# Patient Record
Sex: Male | Born: 1945
Health system: Southern US, Community
[De-identification: ages and names within clinical notes are randomized; demographics above are authoritative.]

## PROBLEM LIST (undated history)

## (undated) DIAGNOSIS — I10 Essential (primary) hypertension: Secondary | ICD-10-CM

## (undated) DIAGNOSIS — E119 Type 2 diabetes mellitus without complications: Secondary | ICD-10-CM

## (undated) HISTORY — PX: EYE SURGERY: SHX253

## (undated) HISTORY — PX: HERNIA REPAIR: SHX51

---

## 2012-09-05 DIAGNOSIS — J069 Acute upper respiratory infection, unspecified: Secondary | ICD-10-CM | POA: Diagnosis not present

## 2013-04-05 DIAGNOSIS — E785 Hyperlipidemia, unspecified: Secondary | ICD-10-CM | POA: Diagnosis not present

## 2013-04-05 DIAGNOSIS — E119 Type 2 diabetes mellitus without complications: Secondary | ICD-10-CM | POA: Diagnosis not present

## 2013-04-05 DIAGNOSIS — E669 Obesity, unspecified: Secondary | ICD-10-CM | POA: Diagnosis not present

## 2013-04-05 DIAGNOSIS — M1990 Unspecified osteoarthritis, unspecified site: Secondary | ICD-10-CM | POA: Diagnosis not present

## 2013-04-05 DIAGNOSIS — I10 Essential (primary) hypertension: Secondary | ICD-10-CM | POA: Diagnosis not present

## 2013-04-11 DIAGNOSIS — S339XXA Sprain of unspecified parts of lumbar spine and pelvis, initial encounter: Secondary | ICD-10-CM | POA: Diagnosis not present

## 2013-04-11 DIAGNOSIS — M999 Biomechanical lesion, unspecified: Secondary | ICD-10-CM | POA: Diagnosis not present

## 2013-04-12 DIAGNOSIS — M999 Biomechanical lesion, unspecified: Secondary | ICD-10-CM | POA: Diagnosis not present

## 2013-04-12 DIAGNOSIS — S339XXA Sprain of unspecified parts of lumbar spine and pelvis, initial encounter: Secondary | ICD-10-CM | POA: Diagnosis not present

## 2013-04-16 DIAGNOSIS — M999 Biomechanical lesion, unspecified: Secondary | ICD-10-CM | POA: Diagnosis not present

## 2013-04-16 DIAGNOSIS — S339XXA Sprain of unspecified parts of lumbar spine and pelvis, initial encounter: Secondary | ICD-10-CM | POA: Diagnosis not present

## 2013-04-18 DIAGNOSIS — S339XXA Sprain of unspecified parts of lumbar spine and pelvis, initial encounter: Secondary | ICD-10-CM | POA: Diagnosis not present

## 2013-04-18 DIAGNOSIS — M999 Biomechanical lesion, unspecified: Secondary | ICD-10-CM | POA: Diagnosis not present

## 2013-04-19 DIAGNOSIS — M999 Biomechanical lesion, unspecified: Secondary | ICD-10-CM | POA: Diagnosis not present

## 2013-04-19 DIAGNOSIS — S339XXA Sprain of unspecified parts of lumbar spine and pelvis, initial encounter: Secondary | ICD-10-CM | POA: Diagnosis not present

## 2013-04-23 DIAGNOSIS — S339XXA Sprain of unspecified parts of lumbar spine and pelvis, initial encounter: Secondary | ICD-10-CM | POA: Diagnosis not present

## 2013-04-23 DIAGNOSIS — M999 Biomechanical lesion, unspecified: Secondary | ICD-10-CM | POA: Diagnosis not present

## 2013-04-25 DIAGNOSIS — M999 Biomechanical lesion, unspecified: Secondary | ICD-10-CM | POA: Diagnosis not present

## 2013-04-25 DIAGNOSIS — S339XXA Sprain of unspecified parts of lumbar spine and pelvis, initial encounter: Secondary | ICD-10-CM | POA: Diagnosis not present

## 2013-04-26 DIAGNOSIS — M999 Biomechanical lesion, unspecified: Secondary | ICD-10-CM | POA: Diagnosis not present

## 2013-04-26 DIAGNOSIS — S339XXA Sprain of unspecified parts of lumbar spine and pelvis, initial encounter: Secondary | ICD-10-CM | POA: Diagnosis not present

## 2013-04-30 DIAGNOSIS — S339XXA Sprain of unspecified parts of lumbar spine and pelvis, initial encounter: Secondary | ICD-10-CM | POA: Diagnosis not present

## 2013-04-30 DIAGNOSIS — M999 Biomechanical lesion, unspecified: Secondary | ICD-10-CM | POA: Diagnosis not present

## 2013-05-02 DIAGNOSIS — M999 Biomechanical lesion, unspecified: Secondary | ICD-10-CM | POA: Diagnosis not present

## 2013-05-02 DIAGNOSIS — S339XXA Sprain of unspecified parts of lumbar spine and pelvis, initial encounter: Secondary | ICD-10-CM | POA: Diagnosis not present

## 2013-05-03 DIAGNOSIS — S339XXA Sprain of unspecified parts of lumbar spine and pelvis, initial encounter: Secondary | ICD-10-CM | POA: Diagnosis not present

## 2013-05-03 DIAGNOSIS — M999 Biomechanical lesion, unspecified: Secondary | ICD-10-CM | POA: Diagnosis not present

## 2013-05-07 DIAGNOSIS — S339XXA Sprain of unspecified parts of lumbar spine and pelvis, initial encounter: Secondary | ICD-10-CM | POA: Diagnosis not present

## 2013-05-07 DIAGNOSIS — M999 Biomechanical lesion, unspecified: Secondary | ICD-10-CM | POA: Diagnosis not present

## 2013-05-10 DIAGNOSIS — S339XXA Sprain of unspecified parts of lumbar spine and pelvis, initial encounter: Secondary | ICD-10-CM | POA: Diagnosis not present

## 2013-05-10 DIAGNOSIS — M999 Biomechanical lesion, unspecified: Secondary | ICD-10-CM | POA: Diagnosis not present

## 2013-05-14 DIAGNOSIS — S339XXA Sprain of unspecified parts of lumbar spine and pelvis, initial encounter: Secondary | ICD-10-CM | POA: Diagnosis not present

## 2013-05-14 DIAGNOSIS — M999 Biomechanical lesion, unspecified: Secondary | ICD-10-CM | POA: Diagnosis not present

## 2013-05-17 DIAGNOSIS — M999 Biomechanical lesion, unspecified: Secondary | ICD-10-CM | POA: Diagnosis not present

## 2013-05-17 DIAGNOSIS — S339XXA Sprain of unspecified parts of lumbar spine and pelvis, initial encounter: Secondary | ICD-10-CM | POA: Diagnosis not present

## 2013-05-21 DIAGNOSIS — S339XXA Sprain of unspecified parts of lumbar spine and pelvis, initial encounter: Secondary | ICD-10-CM | POA: Diagnosis not present

## 2013-05-21 DIAGNOSIS — M999 Biomechanical lesion, unspecified: Secondary | ICD-10-CM | POA: Diagnosis not present

## 2013-05-24 DIAGNOSIS — J309 Allergic rhinitis, unspecified: Secondary | ICD-10-CM | POA: Diagnosis not present

## 2013-05-24 DIAGNOSIS — J209 Acute bronchitis, unspecified: Secondary | ICD-10-CM | POA: Diagnosis not present

## 2013-05-24 DIAGNOSIS — M999 Biomechanical lesion, unspecified: Secondary | ICD-10-CM | POA: Diagnosis not present

## 2013-05-24 DIAGNOSIS — S339XXA Sprain of unspecified parts of lumbar spine and pelvis, initial encounter: Secondary | ICD-10-CM | POA: Diagnosis not present

## 2013-05-30 DIAGNOSIS — M999 Biomechanical lesion, unspecified: Secondary | ICD-10-CM | POA: Diagnosis not present

## 2013-05-30 DIAGNOSIS — L821 Other seborrheic keratosis: Secondary | ICD-10-CM | POA: Diagnosis not present

## 2013-05-30 DIAGNOSIS — L819 Disorder of pigmentation, unspecified: Secondary | ICD-10-CM | POA: Diagnosis not present

## 2013-05-30 DIAGNOSIS — S339XXA Sprain of unspecified parts of lumbar spine and pelvis, initial encounter: Secondary | ICD-10-CM | POA: Diagnosis not present

## 2013-06-06 DIAGNOSIS — M999 Biomechanical lesion, unspecified: Secondary | ICD-10-CM | POA: Diagnosis not present

## 2013-06-06 DIAGNOSIS — S339XXA Sprain of unspecified parts of lumbar spine and pelvis, initial encounter: Secondary | ICD-10-CM | POA: Diagnosis not present

## 2013-06-13 DIAGNOSIS — S339XXA Sprain of unspecified parts of lumbar spine and pelvis, initial encounter: Secondary | ICD-10-CM | POA: Diagnosis not present

## 2013-06-13 DIAGNOSIS — M999 Biomechanical lesion, unspecified: Secondary | ICD-10-CM | POA: Diagnosis not present

## 2013-06-19 DIAGNOSIS — S9030XA Contusion of unspecified foot, initial encounter: Secondary | ICD-10-CM | POA: Diagnosis not present

## 2013-06-27 DIAGNOSIS — M999 Biomechanical lesion, unspecified: Secondary | ICD-10-CM | POA: Diagnosis not present

## 2013-06-27 DIAGNOSIS — S339XXA Sprain of unspecified parts of lumbar spine and pelvis, initial encounter: Secondary | ICD-10-CM | POA: Diagnosis not present

## 2013-07-10 DIAGNOSIS — S9030XA Contusion of unspecified foot, initial encounter: Secondary | ICD-10-CM | POA: Diagnosis not present

## 2013-07-26 DIAGNOSIS — E669 Obesity, unspecified: Secondary | ICD-10-CM | POA: Diagnosis not present

## 2013-07-26 DIAGNOSIS — E119 Type 2 diabetes mellitus without complications: Secondary | ICD-10-CM | POA: Diagnosis not present

## 2013-07-26 DIAGNOSIS — G4730 Sleep apnea, unspecified: Secondary | ICD-10-CM | POA: Diagnosis not present

## 2013-07-26 DIAGNOSIS — E785 Hyperlipidemia, unspecified: Secondary | ICD-10-CM | POA: Diagnosis not present

## 2013-07-26 DIAGNOSIS — I10 Essential (primary) hypertension: Secondary | ICD-10-CM | POA: Diagnosis not present

## 2013-10-24 DIAGNOSIS — I10 Essential (primary) hypertension: Secondary | ICD-10-CM | POA: Diagnosis not present

## 2013-10-24 DIAGNOSIS — E119 Type 2 diabetes mellitus without complications: Secondary | ICD-10-CM | POA: Diagnosis not present

## 2013-10-24 DIAGNOSIS — Z6839 Body mass index (BMI) 39.0-39.9, adult: Secondary | ICD-10-CM | POA: Diagnosis not present

## 2013-10-24 DIAGNOSIS — M25579 Pain in unspecified ankle and joints of unspecified foot: Secondary | ICD-10-CM | POA: Diagnosis not present

## 2013-10-24 DIAGNOSIS — E785 Hyperlipidemia, unspecified: Secondary | ICD-10-CM | POA: Diagnosis not present

## 2013-11-06 DIAGNOSIS — E119 Type 2 diabetes mellitus without complications: Secondary | ICD-10-CM | POA: Diagnosis not present

## 2013-12-06 DIAGNOSIS — D225 Melanocytic nevi of trunk: Secondary | ICD-10-CM | POA: Diagnosis not present

## 2013-12-06 DIAGNOSIS — L820 Inflamed seborrheic keratosis: Secondary | ICD-10-CM | POA: Diagnosis not present

## 2013-12-10 DIAGNOSIS — Z23 Encounter for immunization: Secondary | ICD-10-CM | POA: Diagnosis not present

## 2013-12-10 DIAGNOSIS — Z6839 Body mass index (BMI) 39.0-39.9, adult: Secondary | ICD-10-CM | POA: Diagnosis not present

## 2013-12-10 DIAGNOSIS — Z0000 Encounter for general adult medical examination without abnormal findings: Secondary | ICD-10-CM | POA: Diagnosis not present

## 2014-01-08 DIAGNOSIS — Z1211 Encounter for screening for malignant neoplasm of colon: Secondary | ICD-10-CM | POA: Diagnosis not present

## 2014-01-11 DIAGNOSIS — Z6841 Body Mass Index (BMI) 40.0 and over, adult: Secondary | ICD-10-CM | POA: Diagnosis not present

## 2014-01-11 DIAGNOSIS — J9801 Acute bronchospasm: Secondary | ICD-10-CM | POA: Diagnosis not present

## 2015-02-24 DIAGNOSIS — M109 Gout, unspecified: Secondary | ICD-10-CM | POA: Diagnosis not present

## 2015-02-24 DIAGNOSIS — R161 Splenomegaly, not elsewhere classified: Secondary | ICD-10-CM | POA: Diagnosis not present

## 2015-02-24 DIAGNOSIS — K760 Fatty (change of) liver, not elsewhere classified: Secondary | ICD-10-CM | POA: Diagnosis not present

## 2015-02-24 DIAGNOSIS — E1140 Type 2 diabetes mellitus with diabetic neuropathy, unspecified: Secondary | ICD-10-CM | POA: Diagnosis not present

## 2015-02-24 DIAGNOSIS — I10 Essential (primary) hypertension: Secondary | ICD-10-CM | POA: Diagnosis not present

## 2015-02-24 DIAGNOSIS — J209 Acute bronchitis, unspecified: Secondary | ICD-10-CM | POA: Diagnosis not present

## 2015-02-24 DIAGNOSIS — Z79899 Other long term (current) drug therapy: Secondary | ICD-10-CM | POA: Diagnosis not present

## 2015-02-24 DIAGNOSIS — G4733 Obstructive sleep apnea (adult) (pediatric): Secondary | ICD-10-CM | POA: Diagnosis not present

## 2015-02-24 DIAGNOSIS — N400 Enlarged prostate without lower urinary tract symptoms: Secondary | ICD-10-CM | POA: Diagnosis not present

## 2015-02-24 DIAGNOSIS — K219 Gastro-esophageal reflux disease without esophagitis: Secondary | ICD-10-CM | POA: Diagnosis not present

## 2015-02-24 DIAGNOSIS — E7800 Pure hypercholesterolemia, unspecified: Secondary | ICD-10-CM | POA: Diagnosis not present

## 2015-02-28 DIAGNOSIS — G4733 Obstructive sleep apnea (adult) (pediatric): Secondary | ICD-10-CM | POA: Diagnosis not present

## 2015-03-27 DIAGNOSIS — M1990 Unspecified osteoarthritis, unspecified site: Secondary | ICD-10-CM | POA: Diagnosis not present

## 2015-03-27 DIAGNOSIS — I10 Essential (primary) hypertension: Secondary | ICD-10-CM | POA: Diagnosis not present

## 2015-03-27 DIAGNOSIS — E785 Hyperlipidemia, unspecified: Secondary | ICD-10-CM | POA: Diagnosis not present

## 2015-03-27 DIAGNOSIS — D649 Anemia, unspecified: Secondary | ICD-10-CM | POA: Diagnosis not present

## 2015-03-27 DIAGNOSIS — G4730 Sleep apnea, unspecified: Secondary | ICD-10-CM | POA: Diagnosis not present

## 2015-03-27 DIAGNOSIS — E119 Type 2 diabetes mellitus without complications: Secondary | ICD-10-CM | POA: Diagnosis not present

## 2015-03-27 DIAGNOSIS — E669 Obesity, unspecified: Secondary | ICD-10-CM | POA: Diagnosis not present

## 2015-03-27 DIAGNOSIS — Z6838 Body mass index (BMI) 38.0-38.9, adult: Secondary | ICD-10-CM | POA: Diagnosis not present

## 2015-06-03 DIAGNOSIS — L570 Actinic keratosis: Secondary | ICD-10-CM | POA: Diagnosis not present

## 2015-06-03 DIAGNOSIS — L400 Psoriasis vulgaris: Secondary | ICD-10-CM | POA: Diagnosis not present

## 2015-07-14 DIAGNOSIS — Z6838 Body mass index (BMI) 38.0-38.9, adult: Secondary | ICD-10-CM | POA: Diagnosis not present

## 2015-07-14 DIAGNOSIS — H00014 Hordeolum externum left upper eyelid: Secondary | ICD-10-CM | POA: Diagnosis not present

## 2015-07-25 DIAGNOSIS — G4730 Sleep apnea, unspecified: Secondary | ICD-10-CM | POA: Diagnosis not present

## 2015-07-25 DIAGNOSIS — E669 Obesity, unspecified: Secondary | ICD-10-CM | POA: Diagnosis not present

## 2015-07-25 DIAGNOSIS — I10 Essential (primary) hypertension: Secondary | ICD-10-CM | POA: Diagnosis not present

## 2015-07-25 DIAGNOSIS — M1990 Unspecified osteoarthritis, unspecified site: Secondary | ICD-10-CM | POA: Diagnosis not present

## 2015-07-25 DIAGNOSIS — Z6838 Body mass index (BMI) 38.0-38.9, adult: Secondary | ICD-10-CM | POA: Diagnosis not present

## 2015-07-25 DIAGNOSIS — D649 Anemia, unspecified: Secondary | ICD-10-CM | POA: Diagnosis not present

## 2015-07-25 DIAGNOSIS — E119 Type 2 diabetes mellitus without complications: Secondary | ICD-10-CM | POA: Diagnosis not present

## 2015-07-25 DIAGNOSIS — E785 Hyperlipidemia, unspecified: Secondary | ICD-10-CM | POA: Diagnosis not present

## 2015-08-18 DIAGNOSIS — N200 Calculus of kidney: Secondary | ICD-10-CM | POA: Diagnosis not present

## 2015-08-18 DIAGNOSIS — Z6839 Body mass index (BMI) 39.0-39.9, adult: Secondary | ICD-10-CM | POA: Diagnosis not present

## 2015-10-08 DIAGNOSIS — K529 Noninfective gastroenteritis and colitis, unspecified: Secondary | ICD-10-CM | POA: Diagnosis not present

## 2015-10-08 DIAGNOSIS — Z6838 Body mass index (BMI) 38.0-38.9, adult: Secondary | ICD-10-CM | POA: Diagnosis not present

## 2015-11-03 DIAGNOSIS — G4730 Sleep apnea, unspecified: Secondary | ICD-10-CM | POA: Diagnosis not present

## 2015-11-03 DIAGNOSIS — Z6838 Body mass index (BMI) 38.0-38.9, adult: Secondary | ICD-10-CM | POA: Diagnosis not present

## 2015-11-03 DIAGNOSIS — D649 Anemia, unspecified: Secondary | ICD-10-CM | POA: Diagnosis not present

## 2015-11-03 DIAGNOSIS — M1990 Unspecified osteoarthritis, unspecified site: Secondary | ICD-10-CM | POA: Diagnosis not present

## 2015-11-03 DIAGNOSIS — E119 Type 2 diabetes mellitus without complications: Secondary | ICD-10-CM | POA: Diagnosis not present

## 2015-11-03 DIAGNOSIS — E669 Obesity, unspecified: Secondary | ICD-10-CM | POA: Diagnosis not present

## 2015-11-03 DIAGNOSIS — I10 Essential (primary) hypertension: Secondary | ICD-10-CM | POA: Diagnosis not present

## 2015-11-03 DIAGNOSIS — E785 Hyperlipidemia, unspecified: Secondary | ICD-10-CM | POA: Diagnosis not present

## 2015-11-03 DIAGNOSIS — Z23 Encounter for immunization: Secondary | ICD-10-CM | POA: Diagnosis not present

## 2016-01-08 DIAGNOSIS — L719 Rosacea, unspecified: Secondary | ICD-10-CM | POA: Diagnosis not present

## 2016-01-08 DIAGNOSIS — I8310 Varicose veins of unspecified lower extremity with inflammation: Secondary | ICD-10-CM | POA: Diagnosis not present

## 2016-01-08 DIAGNOSIS — L299 Pruritus, unspecified: Secondary | ICD-10-CM | POA: Diagnosis not present

## 2016-01-09 ENCOUNTER — Encounter (INDEPENDENT_AMBULATORY_CARE_PROVIDER_SITE_OTHER): Payer: Self-pay

## 2016-01-09 ENCOUNTER — Ambulatory Visit (INDEPENDENT_AMBULATORY_CARE_PROVIDER_SITE_OTHER): Payer: PPO | Admitting: Orthopaedic Surgery

## 2016-01-09 ENCOUNTER — Encounter (INDEPENDENT_AMBULATORY_CARE_PROVIDER_SITE_OTHER): Payer: Self-pay | Admitting: Orthopaedic Surgery

## 2016-01-09 ENCOUNTER — Ambulatory Visit (INDEPENDENT_AMBULATORY_CARE_PROVIDER_SITE_OTHER): Payer: PPO

## 2016-01-09 VITALS — BP 112/61 | HR 84 | Ht 70.0 in | Wt 260.0 lb

## 2016-01-09 DIAGNOSIS — M25571 Pain in right ankle and joints of right foot: Secondary | ICD-10-CM

## 2016-01-09 DIAGNOSIS — M25562 Pain in left knee: Secondary | ICD-10-CM

## 2016-01-09 NOTE — Progress Notes (Signed)
Office Visit Note   Patient: Patrick Lawson           Date of Birth: 1945-07-25           MRN: [id number] Visit Date: 01/09/2016              Requested by: No referring provider defined for this encounter. PCP: Charlynn Court, NP   Assessment & Plan: Visit Diagnoses:  1. Left knee pain, unspecified chronicity    2.      Right heel pump bump with Achilles tendon insertional tendinopathy Plan: Patient just received a Kenalog injection for dermatitis may get some great improvement in his left knee. We'll place him in a cam boot for his right Achilles tendinopathy with a heel bump pad, a pad heel lift. Pathophysiology discussed and treatment plan reviewed.  Follow-Up Instructions: No Follow-up on file.   Orders:  Orders Placed This Encounter  Procedures  . XR Knee 1-2 Views Left   No orders of the defined types were placed in this encounter.     Procedures: No procedures performed   Clinical Data: No additional findings.   Subjective: Chief Complaint  Patient presents with  . Right Ankle - Pain  . Left Knee - Pain    Patrick Lawson is here for Right ankle pain and left knee pain.  He states that his right ankle flares up when he increases his activity, he states that the pain is the plantar fascitis pain. For the left knee, he says the pain is on the medial side of the knee and is worse with increase activity or standing to long.  Patient's had pain over the posterior aspect of his right heel with ambulation. He does worse when he goes up hills or walks at a faster speed. He has tried different shoes without relief anti-inflammatories have not been helpful. He denies problems with his left foot. No fever chills. Patient's left knee pain has been medial increases with activity associated with occasional swelling. He had a Kenalog injection yesterday for dermatitis problem. Patient's wife is present and aided in the history of present illness. Review of Systems    Constitutional: Negative for chills and diaphoresis.  HENT: Negative for ear discharge, ear pain and nosebleeds.   Eyes: Negative for discharge and visual disturbance.  Respiratory: Negative for cough, choking and shortness of breath.   Cardiovascular: Negative for chest pain and palpitations.  Gastrointestinal: Negative for abdominal distention and abdominal pain.  Endocrine: Negative for cold intolerance and heat intolerance.  Genitourinary: Negative for flank pain and hematuria.  Musculoskeletal:       Positive for right posterior heel pain in left medial knee pain.  Skin: Negative for rash and wound.  Neurological: Negative for seizures and speech difficulty.  Hematological: Negative for adenopathy. Does not bruise/bleed easily.  Psychiatric/Behavioral: Negative for agitation and suicidal ideas.     Objective: Vital Signs: BP 112/61 (BP Location: Left Arm, Patient Position: Sitting)   Pulse 84   Ht 5\' 10"  (1.778 m)   Wt 260 lb (117.9 kg)   BMI 37.31 kg/m   Physical Exam  Constitutional: He is oriented to person, place, and time. He appears well-developed and well-nourished.  HENT:  Head: Normocephalic and atraumatic.  Eyes: EOM are normal. Pupils are equal, round, and reactive to light.  Neck: No tracheal deviation present. No thyromegaly present.  Cardiovascular: Normal rate.   Pulmonary/Chest: Effort normal. He has no wheezes.  Abdominal: Soft. Bowel sounds are normal.  Musculoskeletal:  Patient has some left knee medial joint line tenderness. Good range of motion without crepitus. Collateral ligament exam is normal. Hip range of motion is normal reflexes are 2+ none nerve root tension signs with straight leg raising. Anterior tib EHL is strong normal heel toe gait the left knee.  Neurological: He is alert and oriented to person, place, and time.  Skin: Skin is warm and dry. Capillary refill takes less than 2 seconds.  Psychiatric: He has a normal mood and affect. His  behavior is normal. Judgment and thought content normal.    Ortho Exam patient has palpable left bump present with the Achilles tendon insertion of the calcaneous unilaterally on the right only. He is exquisitely tender. Plantar fascia origin is nontender post bursa peroneals are strong. Posterior tib has good resistance. No synovitis of the upper extremities, the at the wrist and fingers.  Specialty Comments:  No specialty comments available.  Imaging: No results found.   PMFS History: There are no active problems to display for this patient.  No past medical history on file.  No family history on file.  No past surgical history on file. Social History   Occupational History  . Not on file.   Social History Main Topics  . Smoking status: Never Smoker  . Smokeless tobacco: Never Used  . Alcohol use Not on file  . Drug use: Unknown  . Sexual activity: Not on file

## 2016-02-21 DIAGNOSIS — J0100 Acute maxillary sinusitis, unspecified: Secondary | ICD-10-CM | POA: Diagnosis not present

## 2016-06-02 DIAGNOSIS — L821 Other seborrheic keratosis: Secondary | ICD-10-CM | POA: Diagnosis not present

## 2016-06-02 DIAGNOSIS — D1801 Hemangioma of skin and subcutaneous tissue: Secondary | ICD-10-CM | POA: Diagnosis not present

## 2016-06-02 DIAGNOSIS — L570 Actinic keratosis: Secondary | ICD-10-CM | POA: Diagnosis not present

## 2016-06-02 DIAGNOSIS — L719 Rosacea, unspecified: Secondary | ICD-10-CM | POA: Diagnosis not present

## 2016-06-02 DIAGNOSIS — Z85820 Personal history of malignant melanoma of skin: Secondary | ICD-10-CM | POA: Diagnosis not present

## 2016-07-29 ENCOUNTER — Other Ambulatory Visit: Payer: Self-pay | Admitting: *Deleted

## 2016-07-29 NOTE — Patient Outreach (Signed)
[facility] Screening call, unsuccessful but left a message for a return call. If I do not hear back from the member I will try again within the week.  Laythan Hayter C. Shloima Clinch, MSN, GNP-BC Gerontological Nurse Practitioner [facility] Care Management [phone]  

## 2016-07-30 ENCOUNTER — Other Ambulatory Visit: Payer: Self-pay | Admitting: *Deleted

## 2016-07-30 NOTE — Patient Outreach (Signed)
[facility] 2nd Screening call, unsuccessful but left a message for a return call. If I do not hear back from the member I will try again within the week.  Patrick Lawson. [name], MSN, [facility] Gerontological Nurse Practitioner [facility] Care Management [phone]

## 2016-08-02 ENCOUNTER — Encounter: Payer: Self-pay | Admitting: *Deleted

## 2016-08-02 ENCOUNTER — Other Ambulatory Visit: Payer: Self-pay | Admitting: *Deleted

## 2016-08-02 NOTE — Patient Outreach (Signed)
[facility] Screen: Pt sees primary care MD routinely and goes to the [place]. He gets his medications there and takes them as ordered for chronic problems. He has recently gone on a diet with his wife and he has lost 30#. His FBS are between 93-113. No acute health concerns. No care management needs. Introductory letter sent.  Eulah Pont. [name], MSN, [facility] Gerontological Nurse Practitioner [facility] Care Management [phone]

## 2016-08-25 DIAGNOSIS — I10 Essential (primary) hypertension: Secondary | ICD-10-CM | POA: Diagnosis not present

## 2016-08-25 DIAGNOSIS — Z6833 Body mass index (BMI) 33.0-33.9, adult: Secondary | ICD-10-CM | POA: Diagnosis not present

## 2016-08-25 DIAGNOSIS — E669 Obesity, unspecified: Secondary | ICD-10-CM | POA: Diagnosis not present

## 2016-08-25 DIAGNOSIS — D649 Anemia, unspecified: Secondary | ICD-10-CM | POA: Diagnosis not present

## 2016-08-25 DIAGNOSIS — E785 Hyperlipidemia, unspecified: Secondary | ICD-10-CM | POA: Diagnosis not present

## 2016-08-25 DIAGNOSIS — M1990 Unspecified osteoarthritis, unspecified site: Secondary | ICD-10-CM | POA: Diagnosis not present

## 2016-08-25 DIAGNOSIS — G4730 Sleep apnea, unspecified: Secondary | ICD-10-CM | POA: Diagnosis not present

## 2016-08-25 DIAGNOSIS — E119 Type 2 diabetes mellitus without complications: Secondary | ICD-10-CM | POA: Diagnosis not present

## 2016-08-30 DIAGNOSIS — I959 Hypotension, unspecified: Secondary | ICD-10-CM | POA: Diagnosis not present

## 2016-08-30 DIAGNOSIS — Z6833 Body mass index (BMI) 33.0-33.9, adult: Secondary | ICD-10-CM | POA: Diagnosis not present

## 2016-08-30 DIAGNOSIS — E860 Dehydration: Secondary | ICD-10-CM | POA: Diagnosis not present

## 2016-08-30 DIAGNOSIS — R55 Syncope and collapse: Secondary | ICD-10-CM | POA: Diagnosis not present

## 2016-08-31 DIAGNOSIS — Z1211 Encounter for screening for malignant neoplasm of colon: Secondary | ICD-10-CM | POA: Diagnosis not present

## 2016-09-28 DIAGNOSIS — E113299 Type 2 diabetes mellitus with mild nonproliferative diabetic retinopathy without macular edema, unspecified eye: Secondary | ICD-10-CM | POA: Diagnosis not present

## 2016-09-28 DIAGNOSIS — H25813 Combined forms of age-related cataract, bilateral: Secondary | ICD-10-CM | POA: Diagnosis not present

## 2016-11-16 DIAGNOSIS — H2181 Floppy iris syndrome: Secondary | ICD-10-CM | POA: Diagnosis not present

## 2016-11-16 DIAGNOSIS — H2513 Age-related nuclear cataract, bilateral: Secondary | ICD-10-CM | POA: Diagnosis not present

## 2016-11-16 DIAGNOSIS — H25811 Combined forms of age-related cataract, right eye: Secondary | ICD-10-CM | POA: Diagnosis not present

## 2016-11-16 DIAGNOSIS — Z01818 Encounter for other preprocedural examination: Secondary | ICD-10-CM | POA: Diagnosis not present

## 2016-11-16 DIAGNOSIS — E119 Type 2 diabetes mellitus without complications: Secondary | ICD-10-CM | POA: Diagnosis not present

## 2016-11-16 DIAGNOSIS — H2511 Age-related nuclear cataract, right eye: Secondary | ICD-10-CM | POA: Diagnosis not present

## 2016-11-22 DIAGNOSIS — J209 Acute bronchitis, unspecified: Secondary | ICD-10-CM | POA: Diagnosis not present

## 2016-11-22 DIAGNOSIS — R062 Wheezing: Secondary | ICD-10-CM | POA: Diagnosis not present

## 2016-11-29 DIAGNOSIS — J209 Acute bronchitis, unspecified: Secondary | ICD-10-CM | POA: Diagnosis not present

## 2016-12-02 DIAGNOSIS — Z961 Presence of intraocular lens: Secondary | ICD-10-CM | POA: Diagnosis not present

## 2016-12-13 DIAGNOSIS — Z6835 Body mass index (BMI) 35.0-35.9, adult: Secondary | ICD-10-CM | POA: Diagnosis not present

## 2016-12-13 DIAGNOSIS — J069 Acute upper respiratory infection, unspecified: Secondary | ICD-10-CM | POA: Diagnosis not present

## 2016-12-27 DIAGNOSIS — Z23 Encounter for immunization: Secondary | ICD-10-CM | POA: Diagnosis not present

## 2017-01-04 DIAGNOSIS — H25812 Combined forms of age-related cataract, left eye: Secondary | ICD-10-CM | POA: Diagnosis not present

## 2017-01-04 DIAGNOSIS — E119 Type 2 diabetes mellitus without complications: Secondary | ICD-10-CM | POA: Diagnosis not present

## 2017-01-04 DIAGNOSIS — H2181 Floppy iris syndrome: Secondary | ICD-10-CM | POA: Diagnosis not present

## 2017-01-04 DIAGNOSIS — H2512 Age-related nuclear cataract, left eye: Secondary | ICD-10-CM | POA: Diagnosis not present

## 2017-02-23 DIAGNOSIS — L570 Actinic keratosis: Secondary | ICD-10-CM | POA: Diagnosis not present

## 2017-02-23 DIAGNOSIS — Z85820 Personal history of malignant melanoma of skin: Secondary | ICD-10-CM | POA: Diagnosis not present

## 2017-02-24 DIAGNOSIS — H524 Presbyopia: Secondary | ICD-10-CM | POA: Diagnosis not present

## 2017-06-23 DIAGNOSIS — R609 Edema, unspecified: Secondary | ICD-10-CM | POA: Diagnosis not present

## 2017-06-23 DIAGNOSIS — G2581 Restless legs syndrome: Secondary | ICD-10-CM | POA: Diagnosis not present

## 2017-06-23 DIAGNOSIS — I10 Essential (primary) hypertension: Secondary | ICD-10-CM | POA: Diagnosis not present

## 2017-06-23 DIAGNOSIS — E119 Type 2 diabetes mellitus without complications: Secondary | ICD-10-CM | POA: Diagnosis not present

## 2017-08-11 DIAGNOSIS — M25562 Pain in left knee: Secondary | ICD-10-CM | POA: Diagnosis not present

## 2017-08-11 DIAGNOSIS — I10 Essential (primary) hypertension: Secondary | ICD-10-CM | POA: Diagnosis not present

## 2017-08-11 DIAGNOSIS — E119 Type 2 diabetes mellitus without complications: Secondary | ICD-10-CM | POA: Diagnosis not present

## 2017-08-12 DIAGNOSIS — M1712 Unilateral primary osteoarthritis, left knee: Secondary | ICD-10-CM | POA: Diagnosis not present

## 2017-10-12 DIAGNOSIS — J40 Bronchitis, not specified as acute or chronic: Secondary | ICD-10-CM | POA: Diagnosis not present

## 2017-10-12 DIAGNOSIS — K429 Umbilical hernia without obstruction or gangrene: Secondary | ICD-10-CM | POA: Diagnosis not present

## 2017-10-12 DIAGNOSIS — E119 Type 2 diabetes mellitus without complications: Secondary | ICD-10-CM | POA: Diagnosis not present

## 2017-10-21 DIAGNOSIS — J209 Acute bronchitis, unspecified: Secondary | ICD-10-CM | POA: Diagnosis not present

## 2017-11-28 DIAGNOSIS — M1712 Unilateral primary osteoarthritis, left knee: Secondary | ICD-10-CM | POA: Diagnosis not present

## 2018-01-05 DIAGNOSIS — L0100 Impetigo, unspecified: Secondary | ICD-10-CM | POA: Diagnosis not present

## 2018-03-15 DIAGNOSIS — I10 Essential (primary) hypertension: Secondary | ICD-10-CM | POA: Diagnosis not present

## 2018-03-15 DIAGNOSIS — M150 Primary generalized (osteo)arthritis: Secondary | ICD-10-CM | POA: Diagnosis not present

## 2018-03-15 DIAGNOSIS — Z79899 Other long term (current) drug therapy: Secondary | ICD-10-CM | POA: Diagnosis not present

## 2018-03-15 DIAGNOSIS — F330 Major depressive disorder, recurrent, mild: Secondary | ICD-10-CM | POA: Diagnosis not present

## 2018-03-15 DIAGNOSIS — E119 Type 2 diabetes mellitus without complications: Secondary | ICD-10-CM | POA: Diagnosis not present

## 2018-03-15 DIAGNOSIS — R112 Nausea with vomiting, unspecified: Secondary | ICD-10-CM | POA: Diagnosis not present

## 2018-03-15 DIAGNOSIS — R5381 Other malaise: Secondary | ICD-10-CM | POA: Diagnosis not present

## 2018-03-15 DIAGNOSIS — E782 Mixed hyperlipidemia: Secondary | ICD-10-CM | POA: Diagnosis not present

## 2018-03-15 DIAGNOSIS — R5383 Other fatigue: Secondary | ICD-10-CM | POA: Diagnosis not present

## 2018-03-29 ENCOUNTER — Emergency Department (HOSPITAL_COMMUNITY): Payer: PPO

## 2018-03-29 ENCOUNTER — Encounter (HOSPITAL_COMMUNITY): Payer: Self-pay

## 2018-03-29 ENCOUNTER — Observation Stay (HOSPITAL_COMMUNITY)
Admission: EM | Admit: 2018-03-29 | Discharge: 2018-03-30 | Disposition: A | Discharge status: Home / Self Care | Payer: PPO | Attending: Internal Medicine | Admitting: Internal Medicine

## 2018-03-29 ENCOUNTER — Other Ambulatory Visit: Payer: Self-pay

## 2018-03-29 DIAGNOSIS — R531 Weakness: Secondary | ICD-10-CM

## 2018-03-29 DIAGNOSIS — Z6837 Body mass index (BMI) 37.0-37.9, adult: Secondary | ICD-10-CM | POA: Diagnosis not present

## 2018-03-29 DIAGNOSIS — E1169 Type 2 diabetes mellitus with other specified complication: Secondary | ICD-10-CM | POA: Diagnosis not present

## 2018-03-29 DIAGNOSIS — Z87891 Personal history of nicotine dependence: Secondary | ICD-10-CM | POA: Insufficient documentation

## 2018-03-29 DIAGNOSIS — Z79899 Other long term (current) drug therapy: Secondary | ICD-10-CM | POA: Insufficient documentation

## 2018-03-29 DIAGNOSIS — E669 Obesity, unspecified: Secondary | ICD-10-CM | POA: Diagnosis not present

## 2018-03-29 DIAGNOSIS — Z833 Family history of diabetes mellitus: Secondary | ICD-10-CM | POA: Diagnosis not present

## 2018-03-29 DIAGNOSIS — R5381 Other malaise: Secondary | ICD-10-CM | POA: Diagnosis not present

## 2018-03-29 DIAGNOSIS — R000 Tachycardia, unspecified: Secondary | ICD-10-CM | POA: Diagnosis not present

## 2018-03-29 DIAGNOSIS — R748 Abnormal levels of other serum enzymes: Secondary | ICD-10-CM | POA: Diagnosis not present

## 2018-03-29 DIAGNOSIS — Z791 Long term (current) use of non-steroidal anti-inflammatories (NSAID): Secondary | ICD-10-CM | POA: Insufficient documentation

## 2018-03-29 DIAGNOSIS — R030 Elevated blood-pressure reading, without diagnosis of hypertension: Secondary | ICD-10-CM | POA: Diagnosis not present

## 2018-03-29 DIAGNOSIS — R42 Dizziness and giddiness: Secondary | ICD-10-CM | POA: Diagnosis not present

## 2018-03-29 DIAGNOSIS — N200 Calculus of kidney: Secondary | ICD-10-CM | POA: Diagnosis not present

## 2018-03-29 DIAGNOSIS — R1084 Generalized abdominal pain: Secondary | ICD-10-CM | POA: Insufficient documentation

## 2018-03-29 DIAGNOSIS — Z7984 Long term (current) use of oral hypoglycemic drugs: Secondary | ICD-10-CM | POA: Diagnosis not present

## 2018-03-29 DIAGNOSIS — I959 Hypotension, unspecified: Secondary | ICD-10-CM | POA: Insufficient documentation

## 2018-03-29 DIAGNOSIS — E119 Type 2 diabetes mellitus without complications: Secondary | ICD-10-CM | POA: Diagnosis not present

## 2018-03-29 DIAGNOSIS — F329 Major depressive disorder, single episode, unspecified: Secondary | ICD-10-CM | POA: Insufficient documentation

## 2018-03-29 DIAGNOSIS — E785 Hyperlipidemia, unspecified: Secondary | ICD-10-CM | POA: Diagnosis not present

## 2018-03-29 DIAGNOSIS — N400 Enlarged prostate without lower urinary tract symptoms: Secondary | ICD-10-CM | POA: Diagnosis not present

## 2018-03-29 DIAGNOSIS — R609 Edema, unspecified: Secondary | ICD-10-CM | POA: Diagnosis not present

## 2018-03-29 DIAGNOSIS — I10 Essential (primary) hypertension: Secondary | ICD-10-CM | POA: Diagnosis not present

## 2018-03-29 DIAGNOSIS — I950 Idiopathic hypotension: Secondary | ICD-10-CM | POA: Diagnosis not present

## 2018-03-29 DIAGNOSIS — R5383 Other fatigue: Secondary | ICD-10-CM | POA: Diagnosis not present

## 2018-03-29 HISTORY — DX: Type 2 diabetes mellitus without complications: E11.9

## 2018-03-29 HISTORY — DX: Essential (primary) hypertension: I10

## 2018-03-29 LAB — CBC
HCT: 44.8 % (ref 39.0–52.0)
HCT: 41.1 % (ref 39.0–52.0)
HEMOGLOBIN: 13.7 g/dL (ref 13.0–17.0)
Hemoglobin: 14.5 g/dL (ref 13.0–17.0)
MCH: 30.2 pg (ref 26.0–34.0)
MCH: 30.9 pg (ref 26.0–34.0)
MCHC: 32.4 g/dL (ref 30.0–36.0)
MCHC: 33.3 g/dL (ref 30.0–36.0)
MCV: 93.3 fL (ref 80.0–100.0)
MCV: 92.6 fL (ref 80.0–100.0)
Platelets: 155 10*3/uL (ref 150–400)
Platelets: 141 10*3/uL — ABNORMAL LOW (ref 150–400)
RBC: 4.8 MIL/uL (ref 4.22–5.81)
RBC: 4.44 MIL/uL (ref 4.22–5.81)
RDW: 12.7 % (ref 11.5–15.5)
RDW: 13.1 % (ref 11.5–15.5)
WBC: 9.9 10*3/uL (ref 4.0–10.5)
WBC: 8.2 10*3/uL (ref 4.0–10.5)
nRBC: 0 % (ref 0.0–0.2)
nRBC: 0 % (ref 0.0–0.2)

## 2018-03-29 LAB — COMPREHENSIVE METABOLIC PANEL
ALT: 34 U/L (ref 0–44)
AST: 45 U/L — AB (ref 15–41)
Albumin: 4.2 g/dL (ref 3.5–5.0)
Alkaline Phosphatase: 88 U/L (ref 38–126)
Anion gap: 8 (ref 5–15)
BUN: 15 mg/dL (ref 8–23)
CO2: 25 mmol/L (ref 22–32)
Calcium: 9.4 mg/dL (ref 8.9–10.3)
Chloride: 101 mmol/L (ref 98–111)
Creatinine, Ser: 1.25 mg/dL — ABNORMAL HIGH (ref 0.61–1.24)
GFR calc Af Amer: >60 mL/min (ref >60)
GFR, EST NON AFRICAN AMERICAN: 58 mL/min — AB (ref >60)
Glucose, Bld: 132 mg/dL — ABNORMAL HIGH (ref 70–99)
Potassium: 3.8 mmol/L (ref 3.5–5.1)
Sodium: 134 mmol/L — ABNORMAL LOW (ref 135–145)
Total Bilirubin: 1.3 mg/dL — ABNORMAL HIGH (ref 0.3–1.2)
Total Protein: 7.1 g/dL (ref 6.5–8.1)

## 2018-03-29 LAB — URINALYSIS, ROUTINE W REFLEX MICROSCOPIC
Bacteria, UA: NONE SEEN
Bilirubin Urine: NEGATIVE
Glucose, UA: NEGATIVE mg/dL
Hgb urine dipstick: NEGATIVE
KETONES UR: NEGATIVE mg/dL
Nitrite: NEGATIVE
PH: 5 (ref 5.0–8.0)
Protein, ur: NEGATIVE mg/dL
Specific Gravity, Urine: 1.008 (ref 1.005–1.030)

## 2018-03-29 LAB — SAMPLE TO BLOOD BANK

## 2018-03-29 LAB — LIPASE, BLOOD: Lipase: 52 U/L — ABNORMAL HIGH (ref 11–51)

## 2018-03-29 LAB — CREATININE, SERUM
Creatinine, Ser: 0.93 mg/dL (ref 0.61–1.24)
GFR calc Af Amer: >60 mL/min (ref >60)
GFR calc non Af Amer: >60 mL/min (ref >60)

## 2018-03-29 LAB — LACTIC ACID, PLASMA: Lactic Acid, Venous: 1.2 mmol/L (ref 0.5–1.9)

## 2018-03-29 LAB — I-STAT TROPONIN, ED: Troponin i, poc: 0.01 ng/mL (ref 0.00–0.08)

## 2018-03-29 LAB — D-DIMER, QUANTITATIVE: D-Dimer, Quant: 0.42 ug/mL-FEU (ref 0.00–0.50)

## 2018-03-29 LAB — CBG MONITORING, ED: Glucose-Capillary: 103 mg/dL — ABNORMAL HIGH (ref 70–99)

## 2018-03-29 MED ORDER — IOPAMIDOL (ISOVUE-300) INJECTION 61%
INTRAVENOUS | Status: AC
  Filled 2018-03-29: qty 100

## 2018-03-29 MED ORDER — PANTOPRAZOLE SODIUM 40 MG PO TBEC
40.0000 mg | DELAYED_RELEASE_TABLET | Freq: Every day | ORAL | Status: DC
  Administered 2018-03-30: 40 mg via ORAL
  Filled 2018-03-29: qty 1

## 2018-03-29 MED ORDER — ONDANSETRON HCL 4 MG/2ML IJ SOLN: 4.0000 mg | Freq: Four times a day (QID) | INTRAMUSCULAR | Status: DC | PRN

## 2018-03-29 MED ORDER — SODIUM CHLORIDE (PF) 0.9 % IJ SOLN
INTRAMUSCULAR | Status: AC
  Filled 2018-03-29: qty 50

## 2018-03-29 MED ORDER — LACTATED RINGERS IV BOLUS
1000.0000 mL | Freq: Once | INTRAVENOUS | Status: AC
  Administered 2018-03-29: 1000 mL via INTRAVENOUS

## 2018-03-29 MED ORDER — SODIUM CHLORIDE 0.9 % IV BOLUS (SEPSIS)
1000.0000 mL | Freq: Once | INTRAVENOUS | Status: AC
  Administered 2018-03-29: 1000 mL via INTRAVENOUS

## 2018-03-29 MED ORDER — ACETAMINOPHEN 325 MG PO TABS: 650.0000 mg | ORAL_TABLET | Freq: Four times a day (QID) | ORAL | Status: DC | PRN

## 2018-03-29 MED ORDER — TERAZOSIN HCL 2 MG PO CAPS: 2.0000 mg | ORAL_CAPSULE | Freq: Every day | ORAL | Status: DC

## 2018-03-29 MED ORDER — ATORVASTATIN CALCIUM 40 MG PO TABS: 40.0000 mg | ORAL_TABLET | Freq: Every day | ORAL | Status: DC

## 2018-03-29 MED ORDER — TRAMADOL HCL 50 MG PO TABS: 50.0000 mg | ORAL_TABLET | Freq: Every day | ORAL | Status: DC | PRN

## 2018-03-29 MED ORDER — ACETAMINOPHEN 650 MG RE SUPP: 650.0000 mg | Freq: Four times a day (QID) | RECTAL | Status: DC | PRN

## 2018-03-29 MED ORDER — SODIUM CHLORIDE 0.9 % IV SOLN
1000.0000 mL | INTRAVENOUS | Status: DC
  Administered 2018-03-29: 1000 mL via INTRAVENOUS

## 2018-03-29 MED ORDER — IOPAMIDOL (ISOVUE-300) INJECTION 61%
100.0000 mL | Freq: Once | INTRAVENOUS | Status: AC | PRN
  Administered 2018-03-29: 100 mL via INTRAVENOUS

## 2018-03-29 MED ORDER — INSULIN ASPART 100 UNIT/ML ~~LOC~~ SOLN: 0.0000 [IU] | Freq: Three times a day (TID) | SUBCUTANEOUS | Status: DC

## 2018-03-29 MED ORDER — ENOXAPARIN SODIUM 40 MG/0.4ML ~~LOC~~ SOLN: 40.0000 mg | SUBCUTANEOUS | Status: DC

## 2018-03-29 MED ORDER — ALLOPURINOL 300 MG PO TABS
300.0000 mg | ORAL_TABLET | Freq: Every day | ORAL | Status: DC
  Administered 2018-03-30: 300 mg via ORAL
  Filled 2018-03-29: qty 1

## 2018-03-29 MED ORDER — LACTATED RINGERS IV SOLN
INTRAVENOUS | Status: DC
  Administered 2018-03-29: 22:00:00 via INTRAVENOUS

## 2018-03-29 MED ORDER — ONDANSETRON HCL 4 MG PO TABS: 4.0000 mg | ORAL_TABLET | Freq: Four times a day (QID) | ORAL | Status: DC | PRN

## 2018-03-29 MED ORDER — SERTRALINE HCL 100 MG PO TABS
100.0000 mg | ORAL_TABLET | Freq: Every day | ORAL | Status: DC
  Administered 2018-03-30: 100 mg via ORAL
  Filled 2018-03-29: qty 1

## 2018-03-29 MED ORDER — ROPINIROLE HCL 1 MG PO TABS
2.0000 mg | ORAL_TABLET | Freq: Every day | ORAL | Status: DC
  Administered 2018-03-29: 2 mg via ORAL
  Filled 2018-03-29: qty 2

## 2018-03-29 NOTE — H&P (Signed)
History and Physical    AMORI COLOMB [id number] DOB: 1946-01-15 DOA: 03/29/2018  PCP: Raina Mina., MD  Patient coming from: Home.  Chief Complaint: Weakness.  HPI: Patrick Lawson is a 73 y.o. male with history of hypertension, diabetes mellitus, BPH, hyperlipidemia who was recently increased on his Zoloft dose 3 weeks ago had followed up with his primary care physician today and was found to be hypotensive and tachycardic and was referred to the ER.  Patient denies any nausea vomiting or diarrhea has been having persistent periumbilical pain where he had a surgery previously for umbilical hernia.  Denies chest pain or shortness of breath.  Has been taking his medication as advised.  No fever or chills.  Patient states over the last 3 weeks has been having dizzy spells.  Looking through the notes in care everywhere he was placed on prednisone 3 months ago for bronchitis.  Zoloft dose was recently increased.  ED Course: In the ER patient was orthostatic with heart rate increasing on standing.  Patient states his heart rate also increased on lying also.  Patient feels dizzy on standing.  Labs revealed mildly elevated lipase.  EKG shows sinus tachycardia with nonspecific changes.  CT abdomen pelvis was unremarkable.  UA is unremarkable.  Troponin was negative.  Patient was given 2 L of fluid bolus concerning that patient's symptoms may be due to dehydration due to her orthostatic changes.  Review of Systems: As per HPI, rest all negative.   Past Medical History:  Diagnosis Date  . Diabetes mellitus without complication ([place])   . Hypertension     Past Surgical History:  Procedure Laterality Date  . EYE SURGERY    . HERNIA REPAIR       reports that he has quit smoking. He quit after 30.00 years of use. He has never used smokeless tobacco. He reports previous alcohol use. He reports previous drug use.  No Known Allergies  Family History  Problem Relation Age of Onset  .  Diabetes Mellitus II Mother     Prior to Admission medications   Medication Sig Start Date End Date Taking? Authorizing Provider  ALBUTEROL SULFATE HFA IN Inhale 2 puffs into the lungs every 4 (four) hours as needed (wheezing and SOB).    Yes [provider]  allopurinol (ZYLOPRIM) 300 MG tablet Take 300 mg by mouth daily.    Yes [provider]  B Complex-C (SUPER B COMPLEX PO) Take 1 tablet by mouth daily.   Yes [provider]  BIOTIN PO Take 1 tablet by mouth daily.   Yes [provider]  calcipotriene (DOVONOX) 0.005 % cream Apply 1 application topically daily as needed (rash).    Yes [provider]  CINNAMON PO Take 2,000 mg by mouth daily.   Yes [provider]  clobetasol cream (TEMOVATE) 6.22 % Apply 1 application topically daily as needed.    Yes [provider]  diclofenac sodium (VOLTAREN) 1 % GEL  12/03/15  Yes [provider]  furosemide (LASIX) 40 MG tablet Take 40 mg by mouth 2 (two) times daily.    Yes [provider]  lisinopril (PRINIVIL,ZESTRIL) 40 MG tablet Take 40 mg by mouth daily.    Yes [provider]  metFORMIN (GLUCOPHAGE) 500 MG tablet Take 1,000 mg by mouth at bedtime.   Yes [provider]  Multiple Vitamin (MULTIVITAMIN WITH MINERALS) TABS tablet Take 1 tablet by mouth daily.   Yes [provider]  omeprazole (PRILOSEC) 20 MG capsule Take by mouth.   Yes [provider]  potassium chloride (KLOR-CON) 20 MEQ packet Take 20 mEq by mouth 2 (two) times daily.    Yes [provider]  rOPINIRole (REQUIP) 2 MG tablet Take 2 mg by mouth at bedtime.    Yes [provider]  saxagliptin HCl (ONGLYZA) 2.5 MG TABS tablet Take 2.5 mg by mouth daily.   Yes [provider]  sertraline (ZOLOFT) 100 MG tablet Take 100 mg by mouth daily.   Yes [provider]  simvastatin (ZOCOR) 80 MG tablet Take 40 mg by mouth daily.    Yes  [provider]  tamsulosin (FLOMAX) 0.4 MG CAPS capsule Take 0.4 mg by mouth daily.   Yes [provider]  terazosin (HYTRIN) 2 MG capsule Take 2 mg by mouth at bedtime.    Yes [provider]  traMADol (ULTRAM) 50 MG tablet Take 50 mg by mouth daily as needed for moderate pain.    Yes [provider]  hydrocortisone (ANUSOL-HC) 2.5 % rectal cream Place rectally.    [provider]  minocycline (MINOCIN,DYNACIN) 100 MG capsule  01/08/16   [provider]    Physical Exam: Vitals:   03/29/18 1730 03/29/18 1800 03/29/18 1830 03/29/18 1900  BP: 121/76 120/77 124/82 125/82  Pulse: (!) 101 (!) 107 (!) 104 (!) 105  Resp: 12 14 14 12   Temp:      TempSrc:      SpO2: 97% 96% 96% 96%  Weight:      Height:          Constitutional: Moderately built and nourished. Vitals:   03/29/18 1730 03/29/18 1800 03/29/18 1830 03/29/18 1900  BP: 121/76 120/77 124/82 125/82  Pulse: (!) 101 (!) 107 (!) 104 (!) 105  Resp: 12 14 14 12   Temp:      TempSrc:      SpO2: 97% 96% 96% 96%  Weight:      Height:       Eyes: Anicteric no pallor. ENMT: No discharge from the ears eyes nose and mouth. Neck: No mass felt.  No neck rigidity. Respiratory: No rhonchi or crepitations. Cardiovascular: S1-S2 heard. Abdomen: Soft nontender bowel sounds present. Musculoskeletal: No edema.  No joint effusion. Skin: No rash. Neurologic: Alert awake oriented to time place and person.  Moves all extremities. Psychiatric: Appears normal.  Normal affect.   Labs on Admission: I have personally reviewed following labs and imaging studies  CBC: Recent Labs  Lab 03/29/18 1320  WBC 9.9  HGB 14.5  HCT 44.8  MCV 93.3  PLT 829   Basic Metabolic Panel: Recent Labs  Lab 03/29/18 1320  NA 134*  K 3.8  CL 101  CO2 25  GLUCOSE 132*  BUN 15  CREATININE 1.25*  CALCIUM 9.4   GFR: Estimated Creatinine Clearance: 70.8 mL/min (A) (by C-G formula based on SCr of 1.25  mg/dL (H)). Liver Function Tests: Recent Labs  Lab 03/29/18 1320  AST 45*  ALT 34  ALKPHOS 88  BILITOT 1.3*  PROT 7.1  ALBUMIN 4.2   Recent Labs  Lab 03/29/18 1320  LIPASE 52*   No results for input(s): AMMONIA in the last 168 hours. Coagulation Profile: No results for input(s): INR, PROTIME in the last 168 hours. Cardiac Enzymes: No results for input(s): CKTOTAL, CKMB, CKMBINDEX, TROPONINI in the last 168 hours. BNP (last 3 results) No results for input(s): PROBNP in the last 8760 hours. HbA1C: No  results for input(s): HGBA1C in the last 72 hours. CBG: No results for input(s): GLUCAP in the last 168 hours. Lipid Profile: No results for input(s): CHOL, HDL, LDLCALC, TRIG, CHOLHDL, LDLDIRECT in the last 72 hours. Thyroid Function Tests: No results for input(s): TSH, T4TOTAL, FREET4, T3FREE, THYROIDAB in the last 72 hours. Anemia Panel: No results for input(s): VITAMINB12, FOLATE, FERRITIN, TIBC, IRON, RETICCTPCT in the last 72 hours. Urine analysis:    Component Value Date/Time   COLORURINE STRAW (A) 03/29/2018 1440   APPEARANCEUR CLEAR 03/29/2018 1440   LABSPEC 1.008 03/29/2018 1440   PHURINE 5.0 03/29/2018 1440   GLUCOSEU NEGATIVE 03/29/2018 1440   HGBUR NEGATIVE 03/29/2018 1440   BILIRUBINUR NEGATIVE 03/29/2018 1440   KETONESUR NEGATIVE 03/29/2018 1440   PROTEINUR NEGATIVE 03/29/2018 1440   NITRITE NEGATIVE 03/29/2018 1440   LEUKOCYTESUR TRACE (A) 03/29/2018 1440   Sepsis Labs: @LABRCNTIP (procalcitonin:4,lacticidven:4) )No results found for this or any previous visit (from the past 240 hour(s)).   Radiological Exams on Admission: Dg Chest 2 View  Result Date: 03/29/2018 CLINICAL DATA:  Increased fatigue and weakness, elevated blood pressure, depression, former smoker EXAM: CHEST - 2 VIEW COMPARISON:  09/11/2007 FINDINGS: Normal heart size, mediastinal contours, and pulmonary vascularity. Lungs clear. No infiltrate, pleural effusion or pneumothorax. Bones  unremarkable. IMPRESSION: No acute abnormalities. Electronically Signed   By: Lavonia Dana M.D.   On: 03/29/2018 15:12   Ct Abdomen Pelvis W Contrast  Result Date: 03/29/2018 CLINICAL DATA:  Generalized acute abdominal pain. Nausea and vomiting. EXAM: CT ABDOMEN AND PELVIS WITH CONTRAST TECHNIQUE: Multidetector CT imaging of the abdomen and pelvis was performed using the standard protocol following bolus administration of intravenous contrast. CONTRAST:  16mL ISOVUE-300 IOPAMIDOL (ISOVUE-300) INJECTION 61% COMPARISON:  CT abdomen pelvis 11/12/2014 FINDINGS: LOWER CHEST: There is no basilar pleural or apical pericardial effusion. HEPATOBILIARY: The hepatic contours and density are normal. There is no intra- or extrahepatic biliary dilatation. The gallbladder is normal. PANCREAS: The pancreatic parenchymal contours are normal and there is no ductal dilatation. There is no peripancreatic fluid collection. SPLEEN: Normal. ADRENALS/URINARY TRACT: --Adrenal glands: Normal. --Right kidney/ureter: No hydronephrosis, nephroureterolithiasis, perinephric stranding or solid renal mass. --Left kidney/ureter: Single nonobstructing renal calculus measuring 12 mm. No hydronephrosis, perinephric stranding or solid renal mass. --Urinary bladder: Normal for degree of distention STOMACH/BOWEL: --Stomach/Duodenum: There is no hiatal hernia or other gastric abnormality. The duodenal course and caliber are normal. --Small bowel: No dilatation or inflammation. --Colon: No focal abnormality. --Appendix: Not visualized. No right lower quadrant inflammation or free fluid. VASCULAR/LYMPHATIC: There is aortic atherosclerosis without hemodynamically significant stenosis. No abdominal or pelvic lymphadenopathy. REPRODUCTIVE: Normal prostate size with symmetric seminal vesicles. MUSCULOSKELETAL. Multilevel degenerative disc disease and facet arthrosis. No bony spinal canal stenosis. OTHER: None. IMPRESSION: 1. No acute abdominal or pelvic  abnormality. 2. Single nonobstructing left renal calculus measuring 12 mm, unchanged. Aortic Atherosclerosis (ICD10-I70.0). Electronically Signed   By: Ulyses Jarred M.D.   On: 03/29/2018 18:20    EKG: Independently reviewed.  Sinus tachycardia.  Assessment/Plan Principal Problem:   Orthostatic dizziness Active Problems:   Essential hypertension   Diabetes mellitus type 2 in obese ([place])    1. Orthostatic changes with dizziness and tachycardia -continue with hydration hold antihypertensives for now.  Check d-dimer, THS, cortisol level and check orthostatics in the morning. 2. Abdominal pain with mild elevated lipase.  CT abdomen pelvis was unremarkable.  Repeat LFTs and lipase.  Check troponin. 3. Hypertension holding antihypertensives due to orthostatic changes.  PRN IV  hydralazine for now. 4. Hyperlipidemia on statins. 5. Diabetes mellitus type 2 we will keep patient on sliding scale coverage. 6. History of depression recently Zoloft dose was increased.  No signs of any serotonin syndrome. 7. History of BPH continue present medications.   DVT prophylaxis: Lovenox. Code Status: Full code. Family Communication: Discussed with patient. Disposition Plan: Home. Consults called: None. Admission status: Observation.   Rise Patience MD [facility] Pager [phone].  If 7PM-7AM, please contact night-coverage [email or web address] Password [email or web address]  03/29/2018, 8:37 PM

## 2018-03-29 NOTE — ED Notes (Signed)
Bed: WA03 Expected date:  Expected time:  Means of arrival:  Comments: Ems weakness

## 2018-03-29 NOTE — ED Provider Notes (Signed)
[facility] DEPT Provider Note   CSN: [id number] Arrival date & time: 03/29/18  1247    History   Chief Complaint Chief Complaint  Patient presents with  . Weakness    HPI Patrick Lawson is a 73 y.o. male.     HPI Patient states he went to his primary care doctor today for follow-up on a recent appointment.  Patient states he has had issues with low vitamin D, depression and some lightheadedness.  Patient states at the doctor's office today he was told his blood pressure was low.  He was sent to the ED for further evaluation.  Patient states he has been having some intermittent episodes of nausea and vomiting.  None in the last day however.  He denies any loose stools and has not had any diarrhea.  He has noticed some blood in his stool occasionally but has attributed that to hemorrhoids.  He denies any significant abdominal pain.  Occasionally it does feel sore.  He has not noticed any large amounts of blood in stool.  Patient denies any trouble with chest pain or shortness of breath.  He does feel lightheaded when he stands.  He gets better when he is sitting or lying down.  He denies any recent changes in his medications other than an adjustment in his Zoloft. History reviewed. No pertinent past medical history.  There are no active problems to display for this patient.   History reviewed. No pertinent surgical history.      Home Medications    Prior to Admission medications   Medication Sig Start Date End Date Taking? Authorizing Provider  ALBUTEROL SULFATE HFA IN Inhale 2 puffs into the lungs every 4 (four) hours as needed (wheezing and SOB).    Yes [provider]  allopurinol (ZYLOPRIM) 300 MG tablet Take 300 mg by mouth daily.    Yes [provider]  B Complex-C (SUPER B COMPLEX PO) Take 1 tablet by mouth daily.   Yes [provider]  BIOTIN PO Take 1 tablet by mouth daily.   Yes [provider]    calcipotriene (DOVONOX) 0.005 % cream Apply 1 application topically daily as needed (rash).    Yes [provider]  CINNAMON PO Take 2,000 mg by mouth daily.   Yes [provider]  clobetasol cream (TEMOVATE) 0.26 % Apply 1 application topically daily as needed.    Yes [provider]  diclofenac sodium (VOLTAREN) 1 % GEL  12/03/15  Yes [provider]  furosemide (LASIX) 40 MG tablet Take 40 mg by mouth 2 (two) times daily.    Yes [provider]  lisinopril (PRINIVIL,ZESTRIL) 40 MG tablet Take 40 mg by mouth daily.    Yes [provider]  metFORMIN (GLUCOPHAGE) 500 MG tablet Take 1,000 mg by mouth at bedtime.   Yes [provider]  Multiple Vitamin (MULTIVITAMIN WITH MINERALS) TABS tablet Take 1 tablet by mouth daily.   Yes [provider]  omeprazole (PRILOSEC) 20 MG capsule Take by mouth.   Yes [provider]  potassium chloride (KLOR-CON) 20 MEQ packet Take 20 mEq by mouth 2 (two) times daily.    Yes [provider]  rOPINIRole (REQUIP) 2 MG tablet Take 2 mg by mouth at bedtime.    Yes [provider]  saxagliptin HCl (ONGLYZA) 2.5 MG TABS tablet Take 2.5 mg by mouth daily.   Yes [provider]  sertraline (ZOLOFT) 100 MG tablet Take 100 mg  by mouth daily.   Yes [provider]  simvastatin (ZOCOR) 80 MG tablet Take 40 mg by mouth daily.    Yes [provider]  tamsulosin (FLOMAX) 0.4 MG CAPS capsule Take 0.4 mg by mouth daily.   Yes [provider]  terazosin (HYTRIN) 2 MG capsule Take 2 mg by mouth at bedtime.    Yes [provider]  traMADol (ULTRAM) 50 MG tablet Take 50 mg by mouth daily as needed for moderate pain.    Yes [provider]  hydrocortisone (ANUSOL-HC) 2.5 % rectal cream Place rectally.    [provider]  minocycline (MINOCIN,DYNACIN) 100 MG capsule  01/08/16   [provider]    Family History No  family history on file.  Social History Social History   Tobacco Use  . Smoking status: Former Smoker    Years: 30.00  . Smokeless tobacco: Never Used  Substance Use Topics  . Alcohol use: Not Currently  . Drug use: Not Currently     Allergies   Patient has no known allergies.   Review of Systems Review of Systems  All other systems reviewed and are negative.    Physical Exam Updated Vital Signs BP 109/72   Pulse (!) 102   Temp 98.1 F (36.7 C) (Oral)   Resp 16   Ht 1.778 m (5\' 10" )   Wt 117.9 kg   SpO2 92%   BMI 37.31 kg/m   Physical Exam Vitals signs and nursing note reviewed.  Constitutional:      General: He is not in acute distress.    Appearance: He is well-developed.  HENT:     Head: Normocephalic and atraumatic.     Right Ear: External ear normal.     Left Ear: External ear normal.  Eyes:     General: No scleral icterus.       Right eye: No discharge.        Left eye: No discharge.     Conjunctiva/sclera: Conjunctivae normal.  Neck:     Musculoskeletal: Neck supple.     Trachea: No tracheal deviation.  Cardiovascular:     Rate and Rhythm: Regular rhythm. Tachycardia present.  Pulmonary:     Effort: Pulmonary effort is normal. No respiratory distress.     Breath sounds: Normal breath sounds. No stridor. No wheezing or rales.  Abdominal:     General: Bowel sounds are normal. There is no distension.     Palpations: Abdomen is soft.     Tenderness: There is no guarding or rebound.  Musculoskeletal:        General: No tenderness.  Skin:    General: Skin is warm and dry.     Findings: No rash.  Neurological:     Mental Status: He is alert.     Cranial Nerves: No cranial nerve deficit (no facial droop, extraocular movements intact, no slurred speech).     Sensory: No sensory deficit.     Motor: No abnormal muscle tone or seizure activity.     Coordination: Coordination normal.      ED Treatments / Results  Labs (all labs ordered are  listed, but only abnormal results are displayed) Labs Reviewed  COMPREHENSIVE METABOLIC PANEL - Abnormal; Notable for the following components:      Result Value   Sodium 134 (*)    Glucose, Bld 132 (*)    Creatinine, Ser 1.25 (*)    AST 45 (*)    Total Bilirubin 1.3 (*)  GFR calc non Af Amer 58 (*)    All other components within normal limits  LIPASE, BLOOD - Abnormal; Notable for the following components:   Lipase 52 (*)    All other components within normal limits  URINALYSIS, ROUTINE W REFLEX MICROSCOPIC - Abnormal; Notable for the following components:   Color, Urine STRAW (*)    Leukocytes,Ua TRACE (*)    All other components within normal limits  CBC  SAMPLE TO BLOOD BANK    EKG EKG Interpretation  Date/Time:  Wednesday March 29 2018 13:02:59 EST Ventricular Rate:  105 PR Interval:    QRS Duration: 86 QT Interval:  329 QTC Calculation: 435 R Axis:   78 Text Interpretation:  Sinus tachycardia Low voltage, precordial leads No old tracing to compare Confirmed by Dorie Rank [phone]) on 03/29/2018 1:21:48 PM   Radiology Dg Chest 2 View  Result Date: 03/29/2018 CLINICAL DATA:  Increased fatigue and weakness, elevated blood pressure, depression, former smoker EXAM: CHEST - 2 VIEW COMPARISON:  09/11/2007 FINDINGS: Normal heart size, mediastinal contours, and pulmonary vascularity. Lungs clear. No infiltrate, pleural effusion or pneumothorax. Bones unremarkable. IMPRESSION: No acute abnormalities. Electronically Signed   By: Lavonia Dana M.D.   On: 03/29/2018 15:12    Procedures Procedures (including critical care time)  Medications Ordered in ED Medications  sodium chloride 0.9 % bolus 1,000 mL (1,000 mLs Intravenous New Bag/Given 03/29/18 1328)    Followed by  0.9 %  sodium chloride infusion (1,000 mLs Intravenous New Bag/Given 03/29/18 1329)     Initial Impression / Assessment and Plan / ED Course  I have reviewed the triage vital signs and the nursing  notes.  Pertinent labs & imaging results that were available during my care of the patient were reviewed by me and considered in my medical decision making (see chart for details).  Clinical Course as of Mar 29 1613  Wed Mar 29, 2018  1612 Mild increase in lipase and LFTs noted.  Patient still has mild tachycardia.  Blood pressure is stable.  On repeat exam he does have some discomfort in the upper abdomen.   [TX]  7741 Considering his persistent issues with nausea vomiting, slight laboratory abnormalities in his upper abdominal discomfort I will order CT scan for further evaluation   [JK]    Clinical Course User Index [JK] Dorie Rank, MD     case turned over to Dr Ellender Hose  Final Clinical Impressions(s) / ED Diagnoses  pending   Dorie Rank, MD 03/29/18 [phone]

## 2018-03-29 NOTE — ED Notes (Signed)
Pt provided with urinal. Pt instructed to call out when he has provided a sample

## 2018-03-29 NOTE — ED Provider Notes (Signed)
73 yo M here with intermittent nausea, vomiting, fatigue and weakness. No overt chest pain. No vomiting today but has been intermittent. Noticed some occasional hemorrhoid bleeds. Discomfort in upper abdomen. No SOB. Lightheadedness is more so orthostatic.  CT scan neg. however, he remains persistently tachycardic.  I suspect this is due to dehydration and poor p.o. intake.  However, given his age with persistent tachycardia, will continue fluid resuscitation and admission.   Duffy Bruce, MD 03/30/18 [phone]

## 2018-03-29 NOTE — ED Triage Notes (Signed)
Pt coming via [facility] from doctors office due to increased fatigue and weakness. EMS reports pt had an appointment on the 12th with his PCP for elevated BP and depression, zoloft was increased at that time to BID. Pt was at PCP office today for follow up and reported to fatigue and weakness therefore PCP sent pt out for further evaluation. Pt does report nausea and 4mg  zofran given at PCP office. Pt states nausea/vomiting has been going for 1 month now.

## 2018-03-29 NOTE — ED Notes (Signed)
ED TO INPATIENT HANDOFF REPORT  Name/Age/Gender Patrick Lawson 73 y.o. male  Code Status    Code Status Orders  (From admission, onward)         Start     Ordered   03/29/18 2036  Full code  Continuous     03/29/18 2036        Code Status History    This patient has a current code status but no historical code status.    Advance Directive Documentation     Most Recent Value  Type of Advance Directive  Healthcare Power of Attorney, Living will  Pre-existing out of facility DNR order (yellow form or pink MOST form)  -  "MOST" Form in Place?  -      Home/SNF/Other Home  Chief Complaint fatigue weakness nausea  Level of Care/Admitting Diagnosis ED Disposition    ED Disposition Condition [place]: [place] [100102]  Level of Care: Telemetry [5]  Admit to tele based on following criteria: Monitor for Ischemic changes  Diagnosis: Orthostatic dizziness [4132440]  Admitting Physician: Patrick Lawson [phone]  Attending Physician: Patrick Lawson [[id number]  PT Class (Do Not Modify): Observation [104]  PT Acc Code (Do Not Modify): Observation [10022]       Medical History Past Medical History:  Diagnosis Date  . Diabetes mellitus without complication ([place])   . Hypertension     Allergies No Known Allergies  IV Location/Drains/Wounds Patient Lines/Drains/Airways Status   Active Line/Drains/Airways    Name:   Placement date:   Placement time:   Site:   Days:   Peripheral IV 03/29/18 Right Antecubital   03/29/18    1314    Antecubital   less than 1          Labs/Imaging Results for orders placed or performed during the hospital encounter of 03/29/18 (from the past 48 hour(s))  CBC     Status: None   Collection Time: 03/29/18  1:20 PM  Result Value Ref Range   WBC 9.9 4.0 - 10.5 K/uL   RBC 4.80 4.22 - 5.81 MIL/uL   Hemoglobin 14.5 13.0 - 17.0 g/dL   HCT 44.8 39.0 - 52.0 %   MCV 93.3 80.0 - 100.0 fL    MCH 30.2 26.0 - 34.0 pg   MCHC 32.4 30.0 - 36.0 g/dL   RDW 12.7 11.5 - 15.5 %   Platelets 155 150 - 400 K/uL   nRBC 0.0 0.0 - 0.2 %    Comment: Performed at [facility], [place] [address] [place], [place] [address]  Comprehensive metabolic panel     Status: Abnormal   Collection Time: 03/29/18  1:20 PM  Result Value Ref Range   Sodium 134 (L) 135 - 145 mmol/L   Potassium 3.8 3.5 - 5.1 mmol/L   Chloride 101 98 - 111 mmol/L   CO2 25 22 - 32 mmol/L   Glucose, Bld 132 (H) 70 - 99 mg/dL   BUN 15 8 - 23 mg/dL   Creatinine, Ser 1.25 (H) 0.61 - 1.24 mg/dL   Calcium 9.4 8.9 - 10.3 mg/dL   Total Protein 7.1 6.5 - 8.1 g/dL   Albumin 4.2 3.5 - 5.0 g/dL   AST 45 (H) 15 - 41 U/L   ALT 34 0 - 44 U/L   Alkaline Phosphatase 88 38 - 126 U/L   Total Bilirubin 1.3 (H) 0.3 - 1.2 mg/dL   GFR calc non Af  Amer 58 (L) >60 mL/min   GFR calc Af Amer >60 >60 mL/min   Anion gap 8 5 - 15    Comment: Performed at [facility], [place] [address] [place], [place] [address]  Lipase, blood     Status: Abnormal   Collection Time: 03/29/18  1:20 PM  Result Value Ref Range   Lipase 52 (H) 11 - 51 U/L    Comment: Performed at [facility], [place] [address], [place], [place] [address]  Sample to Blood Bank     Status: None   Collection Time: 03/29/18  1:27 PM  Result Value Ref Range   Blood Bank Specimen SAMPLE AVAILABLE FOR TESTING    Sample Expiration      04/01/2018 Performed at [facility], [place] [address] [place], [place] [address]   Urinalysis, Routine w reflex microscopic     Status: Abnormal   Collection Time: 03/29/18  2:40 PM  Result Value Ref Range   Color, Urine STRAW (A) YELLOW   APPearance CLEAR CLEAR   Specific Gravity, Urine 1.008 1.005 - 1.030   pH 5.0 5.0 - 8.0   Glucose, UA NEGATIVE NEGATIVE mg/dL   Hgb urine dipstick NEGATIVE NEGATIVE   Bilirubin Urine NEGATIVE NEGATIVE   Ketones, ur NEGATIVE NEGATIVE mg/dL    Protein, ur NEGATIVE NEGATIVE mg/dL   Nitrite NEGATIVE NEGATIVE   Leukocytes,Ua TRACE (A) NEGATIVE   RBC / HPF 0-5 0 - 5 RBC/hpf   WBC, UA 6-10 0 - 5 WBC/hpf   Bacteria, UA NONE SEEN NONE SEEN   Mucus PRESENT    Hyaline Casts, UA PRESENT     Comment: Performed at [facility], [place] [address], [place], [place] [address]  I-Stat Troponin, ED (not at [facility])     Status: None   Collection Time: 03/29/18  4:46 PM  Result Value Ref Range   Troponin i, poc 0.01 0.00 - 0.08 ng/mL   Comment 3            Comment: Due to the release kinetics of cTnI, a negative result within the first hours of the onset of symptoms does not rule out myocardial infarction with certainty. If myocardial infarction is still suspected, repeat the test at appropriate intervals.   CBC     Status: Abnormal   Collection Time: 03/29/18  8:36 PM  Result Value Ref Range   WBC 8.2 4.0 - 10.5 K/uL   RBC 4.44 4.22 - 5.81 MIL/uL   Hemoglobin 13.7 13.0 - 17.0 g/dL   HCT 41.1 39.0 - 52.0 %   MCV 92.6 80.0 - 100.0 fL   MCH 30.9 26.0 - 34.0 pg   MCHC 33.3 30.0 - 36.0 g/dL   RDW 13.1 11.5 - 15.5 %   Platelets 141 (L) 150 - 400 K/uL   nRBC 0.0 0.0 - 0.2 %    Comment: Performed at [facility], [place] [address], [place], [place] [address]  Creatinine, serum     Status: None   Collection Time: 03/29/18  8:36 PM  Result Value Ref Range   Creatinine, Ser 0.93 0.61 - 1.24 mg/dL   GFR calc non Af Amer >60 >60 mL/min   GFR calc Af Amer >60 >60 mL/min    Comment: Performed at [facility], [place] [address], [place], [place] [address]  D-dimer, quantitative (not at [facility])     Status: None   Collection Time: 03/29/18  8:36 PM  Result  Value Ref Range   D-Dimer, Quant 0.42 0.00 - 0.50 ug/mL-FEU    Comment: (NOTE) At the manufacturer cut-off of 0.50 ug/mL FEU, this assay has been documented to exclude PE with a sensitivity and negative predictive value of 97 to 99%.  At this time,  this assay has not been approved by the FDA to exclude DVT/VTE. Results should be correlated with clinical presentation. Performed at [facility], [place] [address], [place], [place] [address]   Lactic acid, plasma     Status: None   Collection Time: 03/29/18  8:37 PM  Result Value Ref Range   Lactic Acid, Venous 1.2 0.5 - 1.9 mmol/L    Comment: Performed at [facility], [place] [address] [place], [place] [address]  CBG monitoring, ED     Status: Abnormal   Collection Time: 03/29/18 10:14 PM  Result Value Ref Range   Glucose-Capillary 103 (H) 70 - 99 mg/dL   Dg Chest 2 View  Result Date: 03/29/2018 CLINICAL DATA:  Increased fatigue and weakness, elevated blood pressure, depression, former smoker EXAM: CHEST - 2 VIEW COMPARISON:  09/11/2007 FINDINGS: Normal heart size, mediastinal contours, and pulmonary vascularity. Lungs clear. No infiltrate, pleural effusion or pneumothorax. Bones unremarkable. IMPRESSION: No acute abnormalities. Electronically Signed   By: Lavonia Dana M.D.   On: 03/29/2018 15:12   Ct Abdomen Pelvis W Contrast  Result Date: 03/29/2018 CLINICAL DATA:  Generalized acute abdominal pain. Nausea and vomiting. EXAM: CT ABDOMEN AND PELVIS WITH CONTRAST TECHNIQUE: Multidetector CT imaging of the abdomen and pelvis was performed using the standard protocol following bolus administration of intravenous contrast. CONTRAST:  152mL ISOVUE-300 IOPAMIDOL (ISOVUE-300) INJECTION 61% COMPARISON:  CT abdomen pelvis 11/12/2014 FINDINGS: LOWER CHEST: There is no basilar pleural or apical pericardial effusion. HEPATOBILIARY: The hepatic contours and density are normal. There is no intra- or extrahepatic biliary dilatation. The gallbladder is normal. PANCREAS: The pancreatic parenchymal contours are normal and there is no ductal dilatation. There is no peripancreatic fluid collection. SPLEEN: Normal. ADRENALS/URINARY TRACT: --Adrenal glands: Normal. --Right  kidney/ureter: No hydronephrosis, nephroureterolithiasis, perinephric stranding or solid renal mass. --Left kidney/ureter: Single nonobstructing renal calculus measuring 12 mm. No hydronephrosis, perinephric stranding or solid renal mass. --Urinary bladder: Normal for degree of distention STOMACH/BOWEL: --Stomach/Duodenum: There is no hiatal hernia or other gastric abnormality. The duodenal course and caliber are normal. --Small bowel: No dilatation or inflammation. --Colon: No focal abnormality. --Appendix: Not visualized. No right lower quadrant inflammation or free fluid. VASCULAR/LYMPHATIC: There is aortic atherosclerosis without hemodynamically significant stenosis. No abdominal or pelvic lymphadenopathy. REPRODUCTIVE: Normal prostate size with symmetric seminal vesicles. MUSCULOSKELETAL. Multilevel degenerative disc disease and facet arthrosis. No bony spinal canal stenosis. OTHER: None. IMPRESSION: 1. No acute abdominal or pelvic abnormality. 2. Single nonobstructing left renal calculus measuring 12 mm, unchanged. Aortic Atherosclerosis (ICD10-I70.0). Electronically Signed   By: Patrick Jarred M.D.   On: 03/29/2018 18:20    Pending Labs Unresulted Labs (From admission, onward)    Start     Ordered   04/05/18 0500  Creatinine, serum  (enoxaparin (LOVENOX)    CrCl >/= 30 ml/min)  Weekly,   R    Comments:  while on enoxaparin therapy    03/29/18 2036   03/30/18 7989  Basic metabolic panel  Tomorrow morning,   R     03/29/18 2036   03/30/18 0500  CBC  Tomorrow morning,   R     03/29/18 2036   03/29/18 2037  Cortisol  ONCE -  STAT,   R     03/29/18 2036   03/29/18 2036  HIV antibody (Routine Testing)  Once,   R     03/29/18 2036   03/29/18 2036  TSH  Once,   R     03/29/18 2036          Vitals/Pain Today's Vitals   03/29/18 2030 03/29/18 2100 03/29/18 2130 03/29/18 2200  BP: 138/89 133/71 135/86 (!) 141/89  Pulse: (!) 106 (!) 108 (!) 105 (!) 101  Resp: 17 14 14 13   Temp:      TempSrc:       SpO2: 92% 93% 96% (!) 88%  Weight:      Height:      PainSc:        Isolation Precautions No active isolations  Medications Medications  sodium chloride 0.9 % bolus 1,000 mL (0 mLs Intravenous Stopped 03/29/18 1619)    Followed by  0.9 %  sodium chloride infusion (0 mLs Intravenous Stopped 03/29/18 2221)  iopamidol (ISOVUE-300) 61 % injection (has no administration in time range)  sodium chloride (PF) 0.9 % injection (  Not Given 03/29/18 1729)  lactated ringers infusion ( Intravenous New Bag/Given 03/29/18 2220)  allopurinol (ZYLOPRIM) tablet 300 mg (has no administration in time range)  traMADol (ULTRAM) tablet 50 mg (has no administration in time range)  atorvastatin (LIPITOR) tablet 40 mg (has no administration in time range)  terazosin (HYTRIN) capsule 2 mg (has no administration in time range)  sertraline (ZOLOFT) tablet 100 mg (has no administration in time range)  pantoprazole (PROTONIX) EC tablet 40 mg (has no administration in time range)  rOPINIRole (REQUIP) tablet 2 mg (2 mg Oral Given 03/29/18 2326)  acetaminophen (TYLENOL) tablet 650 mg (has no administration in time range)    Or  acetaminophen (TYLENOL) suppository 650 mg (has no administration in time range)  ondansetron (ZOFRAN) tablet 4 mg (has no administration in time range)    Or  ondansetron (ZOFRAN) injection 4 mg (has no administration in time range)  insulin aspart (novoLOG) injection 0-9 Units (has no administration in time range)  enoxaparin (LOVENOX) injection 40 mg (has no administration in time range)  lactated ringers bolus 1,000 mL (1,000 mLs Intravenous Bolus 03/29/18 1635)  iopamidol (ISOVUE-300) 61 % injection 100 mL (100 mLs Intravenous Contrast Given 03/29/18 1738)    Mobility walks

## 2018-03-29 NOTE — ED Notes (Signed)
Pt to CT scanner at this time 

## 2018-03-30 DIAGNOSIS — R42 Dizziness and giddiness: Secondary | ICD-10-CM | POA: Diagnosis not present

## 2018-03-30 LAB — CBC
HCT: 41.3 % (ref 39.0–52.0)
Hemoglobin: 13.6 g/dL (ref 13.0–17.0)
MCH: 31.1 pg (ref 26.0–34.0)
MCHC: 32.9 g/dL (ref 30.0–36.0)
MCV: 94.3 fL (ref 80.0–100.0)
PLATELETS: 144 10*3/uL — AB (ref 150–400)
RBC: 4.38 MIL/uL (ref 4.22–5.81)
RDW: 12.9 % (ref 11.5–15.5)
WBC: 9 10*3/uL (ref 4.0–10.5)
nRBC: 0 % (ref 0.0–0.2)

## 2018-03-30 LAB — BASIC METABOLIC PANEL
Anion gap: 7 (ref 5–15)
BUN: 10 mg/dL (ref 8–23)
CO2: 26 mmol/L (ref 22–32)
CREATININE: 0.9 mg/dL (ref 0.61–1.24)
Calcium: 9.1 mg/dL (ref 8.9–10.3)
Chloride: 103 mmol/L (ref 98–111)
GFR calc Af Amer: >60 mL/min (ref >60)
GFR calc non Af Amer: >60 mL/min (ref >60)
Glucose, Bld: 133 mg/dL — ABNORMAL HIGH (ref 70–99)
Potassium: 3.5 mmol/L (ref 3.5–5.1)
SODIUM: 136 mmol/L (ref 135–145)

## 2018-03-30 LAB — HEPATIC FUNCTION PANEL
ALT: 30 U/L (ref 0–44)
AST: 44 U/L — ABNORMAL HIGH (ref 15–41)
Albumin: 3.7 g/dL (ref 3.5–5.0)
Alkaline Phosphatase: 75 U/L (ref 38–126)
BILIRUBIN DIRECT: 0.2 mg/dL (ref 0.0–0.2)
BILIRUBIN INDIRECT: 0.7 mg/dL (ref 0.3–0.9)
Total Bilirubin: 0.9 mg/dL (ref 0.3–1.2)
Total Protein: 6.3 g/dL — ABNORMAL LOW (ref 6.5–8.1)

## 2018-03-30 LAB — TROPONIN I: Troponin I: <0.03 ng/mL (ref <0.03)

## 2018-03-30 LAB — CORTISOL: Cortisol, Plasma: 1 ug/dL

## 2018-03-30 LAB — GLUCOSE, CAPILLARY
Glucose-Capillary: 117 mg/dL — ABNORMAL HIGH (ref 70–99)
Glucose-Capillary: 122 mg/dL — ABNORMAL HIGH (ref 70–99)

## 2018-03-30 LAB — TSH: TSH: 2.313 u[IU]/mL (ref 0.350–4.500)

## 2018-03-30 LAB — LIPASE, BLOOD: Lipase: 51 U/L (ref 11–51)

## 2018-03-30 LAB — HIV ANTIBODY (ROUTINE TESTING W REFLEX): HIV Screen 4th Generation wRfx: NONREACTIVE

## 2018-03-30 MED ORDER — HYDRALAZINE HCL 20 MG/ML IJ SOLN: 5.0000 mg | INTRAMUSCULAR | Status: DC | PRN

## 2018-03-30 MED ORDER — POTASSIUM CHLORIDE 20 MEQ PO PACK: 20.0000 meq | PACK | Freq: Every day | ORAL | Status: AC

## 2018-03-30 NOTE — Plan of Care (Signed)
  Problem: Nutrition: Goal: Adequate nutrition will be maintained Outcome: Progressing   Problem: Safety: Goal: Ability to remain free from injury will improve Outcome: Progressing   Problem: Skin Integrity: Goal: Risk for impaired skin integrity will decrease Outcome: Progressing   

## 2018-03-30 NOTE — Discharge Summary (Signed)
Physician Discharge Summary  Patrick Lawson BSJ:[id number] DOB: [birth year] DOA: 03/29/2018  PCP: Raina Mina., MD  Admit date: 03/29/2018 Discharge date: 03/30/2018  Admitted From: Home Disposition:  Home  Discharge Condition:Stable CODE STATUS:FULL Diet recommendation: Heart Healthy   Brief/Interim Summary: HPI: Patrick Lawson is a 73 y.o. male with history of hypertension, diabetes mellitus, BPH, hyperlipidemia who was recently increased on his Zoloft dose 3 weeks ago had followed up with his primary care physician today and was found to be hypotensive and tachycardic and was referred to the ER.  Patient denies any nausea vomiting or diarrhea has been having persistent periumbilical pain where he had a surgery previously for umbilical hernia.  Denies chest pain or shortness of breath.  Has been taking his medication as advised.  No fever or chills.  Patient states over the last 3 weeks has been having dizzy spells.  Looking through the notes in care everywhere he was placed on prednisone 3 months ago for bronchitis.  Zoloft dose was recently increased.  Hospital course: Patient's hospital course remained stable.  He was found to be orthostatic on presentation with tachycardia on standing.  Patient felt dizzy on standing.  Orthostatic vitals were found to be positive.  Started on IV fluids.  This morning his symptoms have remarkably improved.  Orthostatic vitals repeated this morning are negative. Patient was taking Lasix at home for unknown reason.  He was also on Flomax and terazosin for BPH.  We have discontinued Lasix and transition on discharge.  We recommend to follow-up with his PCP in a week.  Following problems were addressed during his hospitalization:  1. Orthostatic changes with dizziness and tachycardia -improved with IV fluids.  Orthostatic vitals taken this morning were negative.  Discontinue Lasix on discharge. 2. Abdominal pain with mild elevated lipase.  CT abdomen pelvis  was unremarkable.    Lipase normal this morning. 3. Hypertension: Resume home medications. 4. Hyperlipidemia on statins. 5. Diabetes mellitus type 2 :Continue home regimen 6. History of depression :recently Zoloft dose was increased.  7. History of BPH continue present medications: On both Flomax and terazosin at home.  Terazosin  discontinued.    Discharge Diagnoses:  Principal Problem:   Orthostatic dizziness Active Problems:   Essential hypertension   Diabetes mellitus type 2 in obese [facility])    Discharge Instructions  Discharge Instructions    Diet - low sodium heart healthy   Complete by:  As directed    Discharge instructions   Complete by:  As directed    1) Follow up with your PCP in a week. 2)We have discontinued Lasix and Terazosin for now.   Increase activity slowly   Complete by:  As directed      Allergies as of 03/30/2018   No Known Allergies     Medication List    STOP taking these medications   furosemide 40 MG tablet Commonly known as:  LASIX   terazosin 2 MG capsule Commonly known as:  HYTRIN     TAKE these medications   ALBUTEROL SULFATE HFA IN Inhale 2 puffs into the lungs every 4 (four) hours as needed (wheezing and SOB).   allopurinol 300 MG tablet Commonly known as:  ZYLOPRIM Take 300 mg by mouth daily.   BIOTIN PO Take 1 tablet by mouth daily.   calcipotriene 0.005 % cream Commonly known as:  DOVONOX Apply 1 application topically daily as needed (rash).   CINNAMON PO Take 2,000 mg by mouth daily.  clobetasol cream 0.05 % Commonly known as:  TEMOVATE Apply 1 application topically daily as needed.   diclofenac sodium 1 % Gel Commonly known as:  VOLTAREN   hydrocortisone 2.5 % rectal cream Commonly known as:  ANUSOL-HC Place rectally.   lisinopril 40 MG tablet Commonly known as:  PRINIVIL,ZESTRIL Take 40 mg by mouth daily.   metFORMIN 500 MG tablet Commonly known as:  GLUCOPHAGE Take 1,000 mg by mouth at bedtime.    minocycline 100 MG capsule Commonly known as:  MINOCIN,DYNACIN   multivitamin with minerals Tabs tablet Take 1 tablet by mouth daily.   omeprazole 20 MG capsule Commonly known as:  PRILOSEC Take by mouth.   potassium chloride 20 MEQ packet Commonly known as:  KLOR-CON Take 20 mEq by mouth daily. What changed:  when to take this   rOPINIRole 2 MG tablet Commonly known as:  REQUIP Take 2 mg by mouth at bedtime.   saxagliptin HCl 2.5 MG Tabs tablet Commonly known as:  ONGLYZA Take 2.5 mg by mouth daily.   sertraline 100 MG tablet Commonly known as:  ZOLOFT Take 100 mg by mouth daily.   simvastatin 80 MG tablet Commonly known as:  ZOCOR Take 40 mg by mouth daily.   SUPER B COMPLEX PO Take 1 tablet by mouth daily.   tamsulosin 0.4 MG Caps capsule Commonly known as:  FLOMAX Take 0.4 mg by mouth daily.   traMADol 50 MG tablet Commonly known as:  ULTRAM Take 50 mg by mouth daily as needed for moderate pain.      Follow-up Information    Raina Mina., MD. Schedule an appointment as soon as possible for a visit in 1 week(s).   Specialty:  Internal Medicine Contact information: [place] [address] [phone]          No Known Allergies  Consultations:  None   Procedures/Studies: Dg Chest 2 View  Result Date: 03/29/2018 CLINICAL DATA:  Increased fatigue and weakness, elevated blood pressure, depression, former smoker EXAM: CHEST - 2 VIEW COMPARISON:  09/11/2007 FINDINGS: Normal heart size, mediastinal contours, and pulmonary vascularity. Lungs clear. No infiltrate, pleural effusion or pneumothorax. Bones unremarkable. IMPRESSION: No acute abnormalities. Electronically Signed   By: Lavonia Dana M.D.   On: 03/29/2018 15:12   Ct Abdomen Pelvis W Contrast  Result Date: 03/29/2018 CLINICAL DATA:  Generalized acute abdominal pain. Nausea and vomiting. EXAM: CT ABDOMEN AND PELVIS WITH CONTRAST TECHNIQUE: Multidetector CT imaging of the  abdomen and pelvis was performed using the standard protocol following bolus administration of intravenous contrast. CONTRAST:  145mL ISOVUE-300 IOPAMIDOL (ISOVUE-300) INJECTION 61% COMPARISON:  CT abdomen pelvis 11/12/2014 FINDINGS: LOWER CHEST: There is no basilar pleural or apical pericardial effusion. HEPATOBILIARY: The hepatic contours and density are normal. There is no intra- or extrahepatic biliary dilatation. The gallbladder is normal. PANCREAS: The pancreatic parenchymal contours are normal and there is no ductal dilatation. There is no peripancreatic fluid collection. SPLEEN: Normal. ADRENALS/URINARY TRACT: --Adrenal glands: Normal. --Right kidney/ureter: No hydronephrosis, nephroureterolithiasis, perinephric stranding or solid renal mass. --Left kidney/ureter: Single nonobstructing renal calculus measuring 12 mm. No hydronephrosis, perinephric stranding or solid renal mass. --Urinary bladder: Normal for degree of distention STOMACH/BOWEL: --Stomach/Duodenum: There is no hiatal hernia or other gastric abnormality. The duodenal course and caliber are normal. --Small bowel: No dilatation or inflammation. --Colon: No focal abnormality. --Appendix: Not visualized. No right lower quadrant inflammation or free fluid. VASCULAR/LYMPHATIC: There is aortic atherosclerosis without hemodynamically significant stenosis. No abdominal or  pelvic lymphadenopathy. REPRODUCTIVE: Normal prostate size with symmetric seminal vesicles. MUSCULOSKELETAL. Multilevel degenerative disc disease and facet arthrosis. No bony spinal canal stenosis. OTHER: None. IMPRESSION: 1. No acute abdominal or pelvic abnormality. 2. Single nonobstructing left renal calculus measuring 12 mm, unchanged. Aortic Atherosclerosis (ICD10-I70.0). Electronically Signed   By: Ulyses Jarred M.D.   On: 03/29/2018 18:20       Subjective: Patient seen and examined the bedside this morning.  Remains comfortable.  He does not feel any lightheaded or dizzy  this morning.  Orthostatic vitals taken  this morning negative.  Stable for discharge  Discharge Exam: Vitals:   03/30/18 0514 03/30/18 1018  BP: 131/71 128/73  Pulse: 93 (!) 102  Resp: 17 17  Temp: 97.6 F (36.4 C) 99 F (37.2 C)  SpO2: 95%    Vitals:   03/29/18 2300 03/30/18 0045 03/30/18 0514 03/30/18 1018  BP: 131/85 121/72 131/71 128/73  Pulse: 97 (!) 102 93 (!) 102  Resp: 17 17 17 17   Temp:  (!) 97.4 F (36.3 C) 97.6 F (36.4 C) 99 F (37.2 C)  TempSrc:  Oral Oral Oral  SpO2: 96% 95% 95%   Weight:      Height:        General: Pt is alert, awake, not in acute distress,obese Cardiovascular: RRR, S1/S2 +, no rubs, no gallops Respiratory: CTA bilaterally, no wheezing, no rhonchi Abdominal: Soft, NT, ND, bowel sounds + Extremities: no edema, no cyanosis    The results of significant diagnostics from this hospitalization (including imaging, microbiology, ancillary and laboratory) are listed below for reference.     Microbiology: No results found for this or any previous visit (from the past 240 hour(s)).   Labs: BNP (last 3 results) No results for input(s): BNP in the last 8760 hours. Basic Metabolic Panel: Recent Labs  Lab 03/29/18 1320 03/29/18 2036 03/30/18 0214  NA 134*  --  136  K 3.8  --  3.5  CL 101  --  103  CO2 25  --  26  GLUCOSE 132*  --  133*  BUN 15  --  10  CREATININE 1.25* 0.93 0.90  CALCIUM 9.4  --  9.1   Liver Function Tests: Recent Labs  Lab 03/29/18 1320 03/30/18 0719  AST 45* 44*  ALT 34 30  ALKPHOS 88 75  BILITOT 1.3* 0.9  PROT 7.1 6.3*  ALBUMIN 4.2 3.7   Recent Labs  Lab 03/29/18 1320 03/30/18 0719  LIPASE 52* 51   No results for input(s): AMMONIA in the last 168 hours. CBC: Recent Labs  Lab 03/29/18 1320 03/29/18 2036 03/30/18 0214  WBC 9.9 8.2 9.0  HGB 14.5 13.7 13.6  HCT 44.8 41.1 41.3  MCV 93.3 92.6 94.3  PLT 155 141* 144*   Cardiac Enzymes: Recent Labs  Lab 03/30/18 0719  TROPONINI <0.03    BNP: Invalid input(s): POCBNP CBG: Recent Labs  Lab 03/29/18 2214 03/30/18 0813  GLUCAP 103* 117*   D-Dimer Recent Labs    03/29/18 2036  DDIMER 0.42   Hgb A1c No results for input(s): HGBA1C in the last 72 hours. Lipid Profile No results for input(s): CHOL, HDL, LDLCALC, TRIG, CHOLHDL, LDLDIRECT in the last 72 hours. Thyroid function studies Recent Labs    03/30/18 0214  TSH 2.313   Anemia work up No results for input(s): VITAMINB12, FOLATE, FERRITIN, TIBC, IRON, RETICCTPCT in the last 72 hours. Urinalysis    Component Value Date/Time   COLORURINE STRAW (A) 03/29/2018 1440  APPEARANCEUR CLEAR 03/29/2018 1440   LABSPEC 1.008 03/29/2018 1440   PHURINE 5.0 03/29/2018 1440   GLUCOSEU NEGATIVE 03/29/2018 1440   HGBUR NEGATIVE 03/29/2018 1440   Elizabeth 03/29/2018 1440   KETONESUR NEGATIVE 03/29/2018 1440   PROTEINUR NEGATIVE 03/29/2018 1440   NITRITE NEGATIVE 03/29/2018 1440   LEUKOCYTESUR TRACE (A) 03/29/2018 1440   Sepsis Labs Invalid input(s): PROCALCITONIN,  WBC,  LACTICIDVEN Microbiology No results found for this or any previous visit (from the past 240 hour(s)).  Please note: You were cared for by a hospitalist during your hospital stay. Once you are discharged, your primary care physician will handle any further medical issues. Please note that NO REFILLS for any discharge medications will be authorized once you are discharged, as it is imperative that you return to your primary care physician (or establish a relationship with a primary care physician if you do not have one) for your post hospital discharge needs so that they can reassess your need for medications and monitor your lab values.    Time coordinating discharge: 40 minutes  SIGNED:   Shelly Coss, MD  [facility] 03/30/2018, 12:08 PM Pager [phone]  If 7PM-7AM, please contact night-coverage [email or web address] Password [email or web address]

## 2018-04-12 DIAGNOSIS — F330 Major depressive disorder, recurrent, mild: Secondary | ICD-10-CM | POA: Diagnosis not present

## 2018-04-12 DIAGNOSIS — R5383 Other fatigue: Secondary | ICD-10-CM | POA: Diagnosis not present

## 2018-04-12 DIAGNOSIS — R609 Edema, unspecified: Secondary | ICD-10-CM | POA: Diagnosis not present

## 2018-04-12 DIAGNOSIS — E119 Type 2 diabetes mellitus without complications: Secondary | ICD-10-CM | POA: Diagnosis not present

## 2018-04-12 DIAGNOSIS — R5381 Other malaise: Secondary | ICD-10-CM | POA: Diagnosis not present

## 2018-04-12 DIAGNOSIS — I10 Essential (primary) hypertension: Secondary | ICD-10-CM | POA: Diagnosis not present

## 2018-07-11 DIAGNOSIS — Z20828 Contact with and (suspected) exposure to other viral communicable diseases: Secondary | ICD-10-CM | POA: Diagnosis not present

## 2018-07-11 DIAGNOSIS — Z87891 Personal history of nicotine dependence: Secondary | ICD-10-CM | POA: Diagnosis not present

## 2018-07-11 DIAGNOSIS — J069 Acute upper respiratory infection, unspecified: Secondary | ICD-10-CM | POA: Diagnosis not present

## 2018-07-11 DIAGNOSIS — R112 Nausea with vomiting, unspecified: Secondary | ICD-10-CM | POA: Diagnosis not present

## 2018-08-31 DIAGNOSIS — D225 Melanocytic nevi of trunk: Secondary | ICD-10-CM | POA: Diagnosis not present

## 2018-08-31 DIAGNOSIS — D1801 Hemangioma of skin and subcutaneous tissue: Secondary | ICD-10-CM | POA: Diagnosis not present

## 2018-08-31 DIAGNOSIS — L820 Inflamed seborrheic keratosis: Secondary | ICD-10-CM | POA: Diagnosis not present

## 2018-08-31 DIAGNOSIS — D2239 Melanocytic nevi of other parts of face: Secondary | ICD-10-CM | POA: Diagnosis not present

## 2018-08-31 DIAGNOSIS — D485 Neoplasm of uncertain behavior of skin: Secondary | ICD-10-CM | POA: Diagnosis not present

## 2018-08-31 DIAGNOSIS — L814 Other melanin hyperpigmentation: Secondary | ICD-10-CM | POA: Diagnosis not present

## 2019-02-12 DIAGNOSIS — H532 Diplopia: Secondary | ICD-10-CM | POA: Diagnosis not present

## 2019-02-12 DIAGNOSIS — R519 Headache, unspecified: Secondary | ICD-10-CM | POA: Diagnosis not present

## 2019-02-12 DIAGNOSIS — Z7984 Long term (current) use of oral hypoglycemic drugs: Secondary | ICD-10-CM | POA: Diagnosis not present

## 2019-02-12 DIAGNOSIS — Z862 Personal history of diseases of the blood and blood-forming organs and certain disorders involving the immune mechanism: Secondary | ICD-10-CM | POA: Diagnosis not present

## 2019-02-12 DIAGNOSIS — I10 Essential (primary) hypertension: Secondary | ICD-10-CM | POA: Diagnosis not present

## 2019-02-12 DIAGNOSIS — I6522 Occlusion and stenosis of left carotid artery: Secondary | ICD-10-CM | POA: Diagnosis not present

## 2019-02-14 DIAGNOSIS — H4922 Sixth [abducent] nerve palsy, left eye: Secondary | ICD-10-CM | POA: Diagnosis not present

## 2019-03-07 DIAGNOSIS — E559 Vitamin D deficiency, unspecified: Secondary | ICD-10-CM | POA: Diagnosis not present

## 2019-03-07 DIAGNOSIS — I10 Essential (primary) hypertension: Secondary | ICD-10-CM | POA: Diagnosis not present

## 2019-03-07 DIAGNOSIS — E782 Mixed hyperlipidemia: Secondary | ICD-10-CM | POA: Diagnosis not present

## 2019-03-07 DIAGNOSIS — R5381 Other malaise: Secondary | ICD-10-CM | POA: Diagnosis not present

## 2019-03-07 DIAGNOSIS — R5383 Other fatigue: Secondary | ICD-10-CM | POA: Diagnosis not present

## 2019-03-07 DIAGNOSIS — K219 Gastro-esophageal reflux disease without esophagitis: Secondary | ICD-10-CM | POA: Diagnosis not present

## 2019-03-07 DIAGNOSIS — F330 Major depressive disorder, recurrent, mild: Secondary | ICD-10-CM | POA: Diagnosis not present

## 2019-03-07 DIAGNOSIS — E119 Type 2 diabetes mellitus without complications: Secondary | ICD-10-CM | POA: Diagnosis not present

## 2019-03-14 DIAGNOSIS — H4921 Sixth [abducent] nerve palsy, right eye: Secondary | ICD-10-CM | POA: Diagnosis not present

## 2019-04-02 DIAGNOSIS — E538 Deficiency of other specified B group vitamins: Secondary | ICD-10-CM | POA: Diagnosis not present

## 2019-04-10 DIAGNOSIS — R21 Rash and other nonspecific skin eruption: Secondary | ICD-10-CM | POA: Diagnosis not present

## 2019-05-16 DIAGNOSIS — H4921 Sixth [abducent] nerve palsy, right eye: Secondary | ICD-10-CM | POA: Diagnosis not present

## 2019-05-16 DIAGNOSIS — H4922 Sixth [abducent] nerve palsy, left eye: Secondary | ICD-10-CM | POA: Diagnosis not present

## 2019-06-21 DIAGNOSIS — M1712 Unilateral primary osteoarthritis, left knee: Secondary | ICD-10-CM | POA: Diagnosis not present

## 2019-11-01 DIAGNOSIS — Z23 Encounter for immunization: Secondary | ICD-10-CM | POA: Diagnosis not present

## 2019-11-06 DIAGNOSIS — D2239 Melanocytic nevi of other parts of face: Secondary | ICD-10-CM | POA: Diagnosis not present

## 2019-11-06 DIAGNOSIS — D1801 Hemangioma of skin and subcutaneous tissue: Secondary | ICD-10-CM | POA: Diagnosis not present

## 2019-11-06 DIAGNOSIS — D225 Melanocytic nevi of trunk: Secondary | ICD-10-CM | POA: Diagnosis not present

## 2019-11-06 DIAGNOSIS — L219 Seborrheic dermatitis, unspecified: Secondary | ICD-10-CM | POA: Diagnosis not present

## 2020-10-10 DIAGNOSIS — Z7984 Long term (current) use of oral hypoglycemic drugs: Secondary | ICD-10-CM | POA: Diagnosis not present

## 2020-10-10 DIAGNOSIS — K219 Gastro-esophageal reflux disease without esophagitis: Secondary | ICD-10-CM | POA: Diagnosis not present

## 2020-10-10 DIAGNOSIS — Z87891 Personal history of nicotine dependence: Secondary | ICD-10-CM | POA: Diagnosis not present

## 2020-10-10 DIAGNOSIS — N390 Urinary tract infection, site not specified: Secondary | ICD-10-CM | POA: Diagnosis not present

## 2020-10-10 DIAGNOSIS — Z85828 Personal history of other malignant neoplasm of skin: Secondary | ICD-10-CM | POA: Diagnosis not present

## 2020-10-10 DIAGNOSIS — N134 Hydroureter: Secondary | ICD-10-CM | POA: Diagnosis not present

## 2020-10-10 DIAGNOSIS — E119 Type 2 diabetes mellitus without complications: Secondary | ICD-10-CM | POA: Diagnosis not present

## 2020-10-10 DIAGNOSIS — Z79899 Other long term (current) drug therapy: Secondary | ICD-10-CM | POA: Diagnosis not present

## 2020-10-10 DIAGNOSIS — R319 Hematuria, unspecified: Secondary | ICD-10-CM | POA: Diagnosis not present

## 2020-10-10 DIAGNOSIS — I10 Essential (primary) hypertension: Secondary | ICD-10-CM | POA: Diagnosis not present

## 2020-10-10 DIAGNOSIS — N200 Calculus of kidney: Secondary | ICD-10-CM | POA: Diagnosis not present

## 2021-01-29 IMAGING — CT CT ABD-PELV W/ CM
2 of 5 series · 16 of 46 positions shown, 18 images · IV contrast (ISOVUE)
Comparison: CT abdomen pelvis 11/12/2014

CLINICAL DATA: Generalized acute abdominal pain. Nausea and
vomiting.

EXAM:
CT ABDOMEN AND PELVIS WITH CONTRAST
TECHNIQUE: Multidetector CT imaging of the abdomen and pelvis was performed
using the standard protocol following bolus administration of
intravenous contrast.
CONTRAST:  100mL ZEMDJS-GEE IOPAMIDOL (ZEMDJS-GEE) INJECTION 61%

[Series 2: axial st · axial · 0.98mm/px · z∈[-359,+16]mm · 13 of 89 slices shown, 15 images]
[im 7/89  soft-tissue]
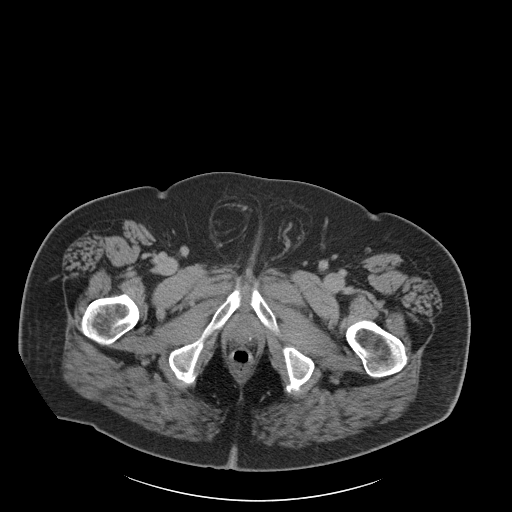
[im 7/89  bone]
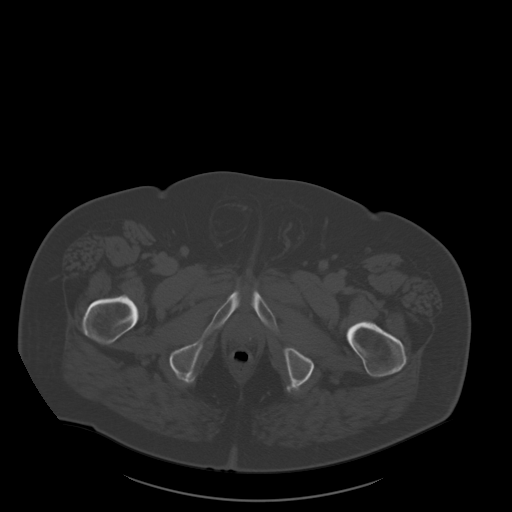
[im 13/89  soft-tissue]
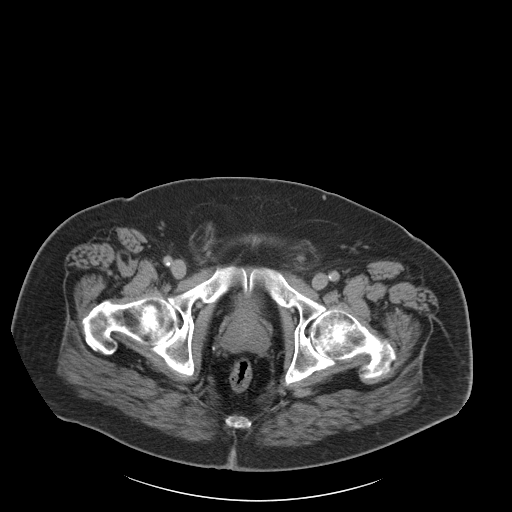
[im 19/89  soft-tissue]
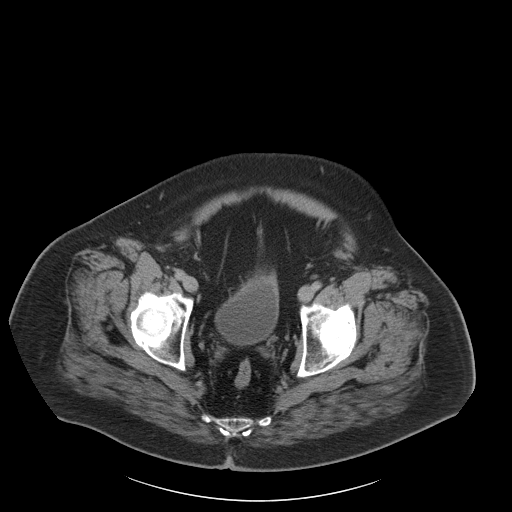
[im 26/89  soft-tissue]
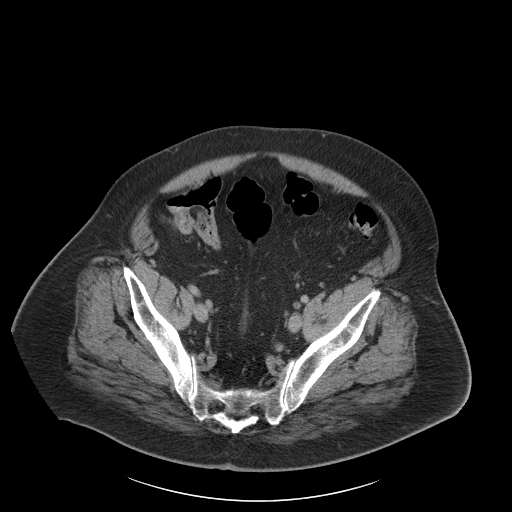
[im 32/89  soft-tissue]
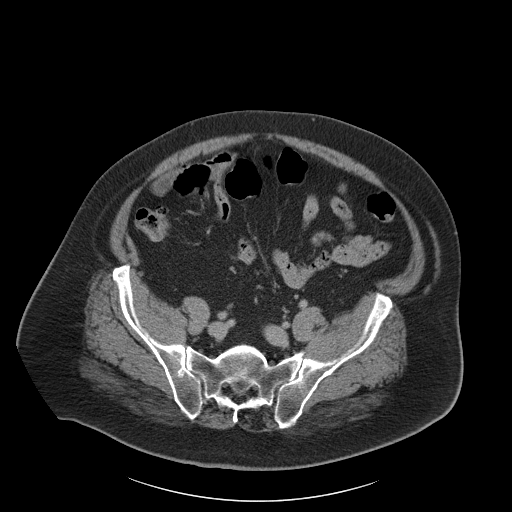
[im 38/89  soft-tissue]
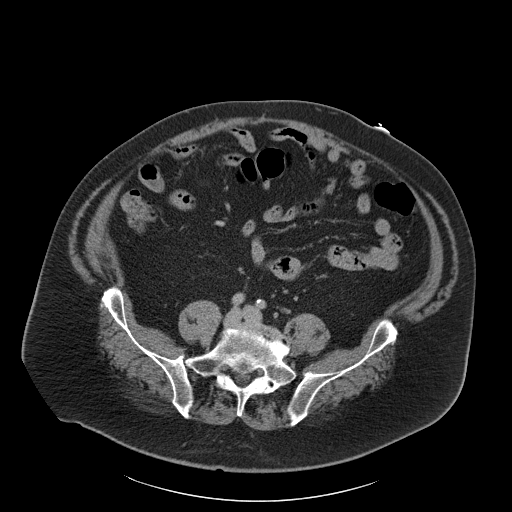
[im 45/89  soft-tissue]
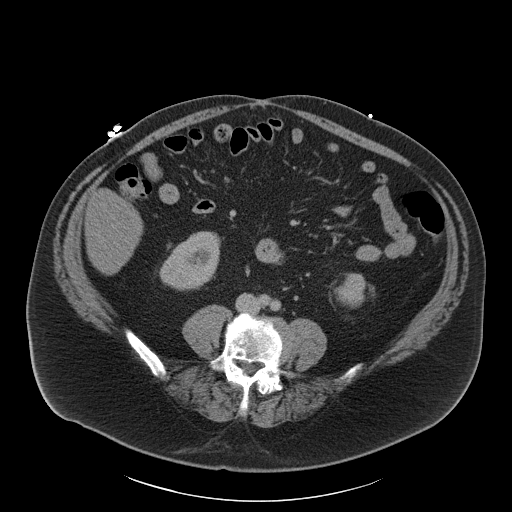
[im 51/89  soft-tissue]
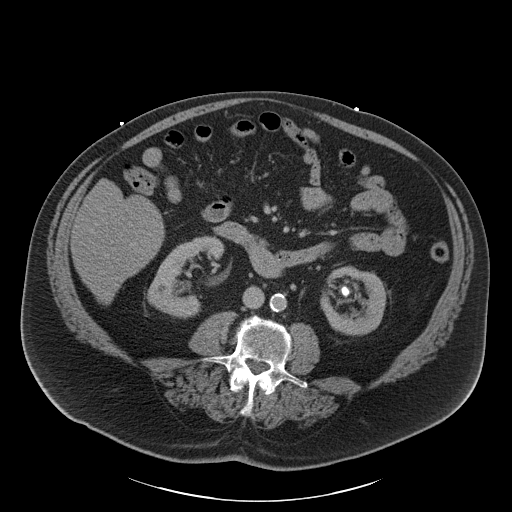
[im 57/89  soft-tissue]
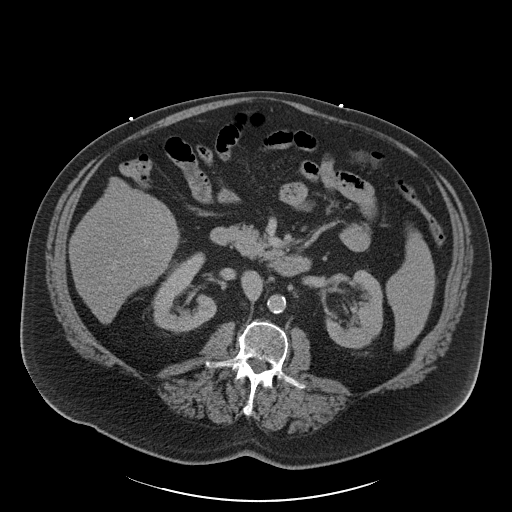
[im 57/89  bone]
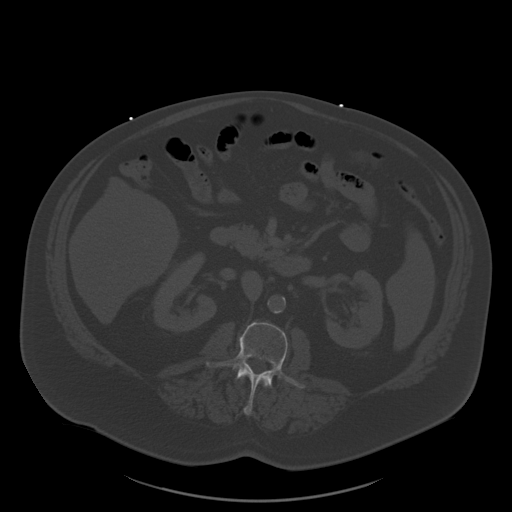
[im 63/89  soft-tissue]
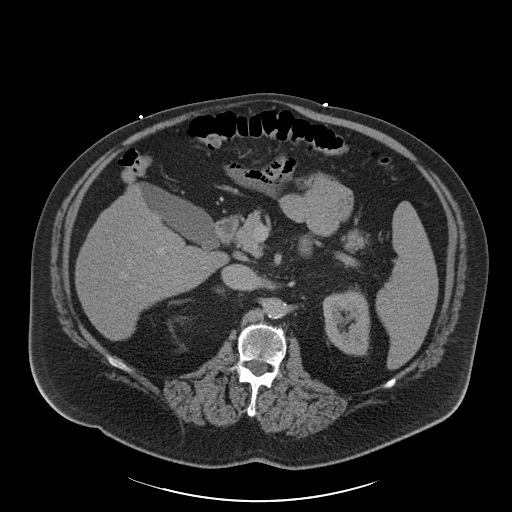
[im 70/89  soft-tissue]
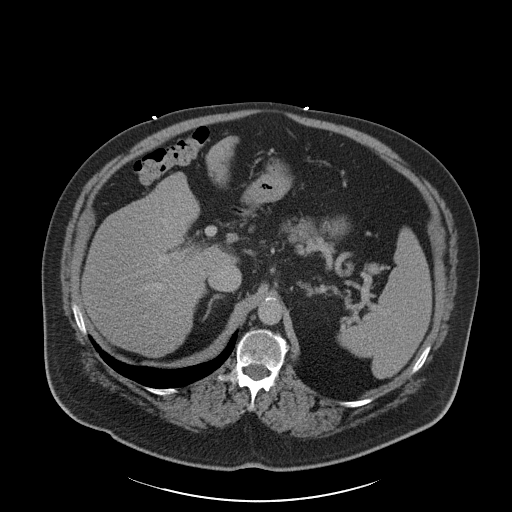
[im 76/89  soft-tissue]
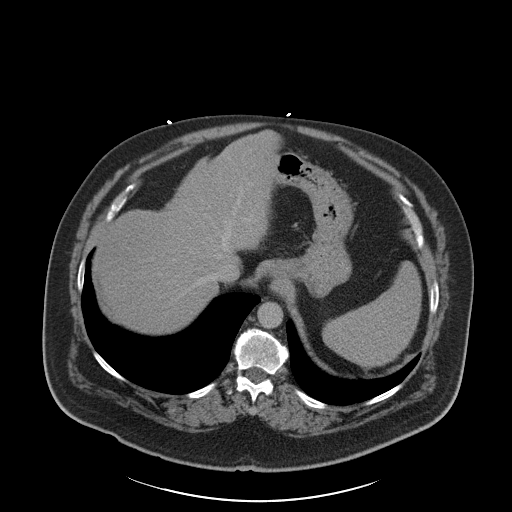
[im 82/89  soft-tissue]
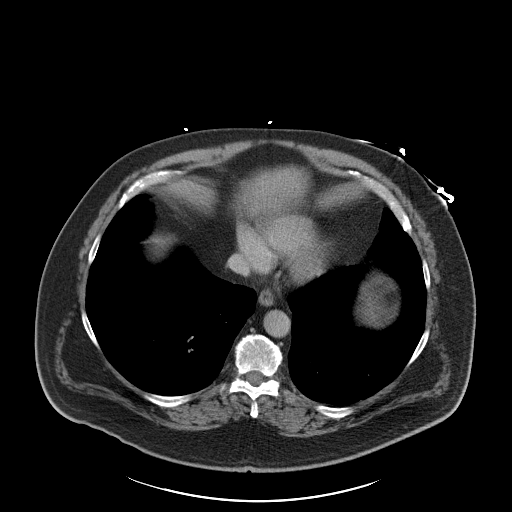

[Series 5: coronal st · coronal · 0.87mm/px · 3 of 191 slices shown]
[im 64/191  soft-tissue]
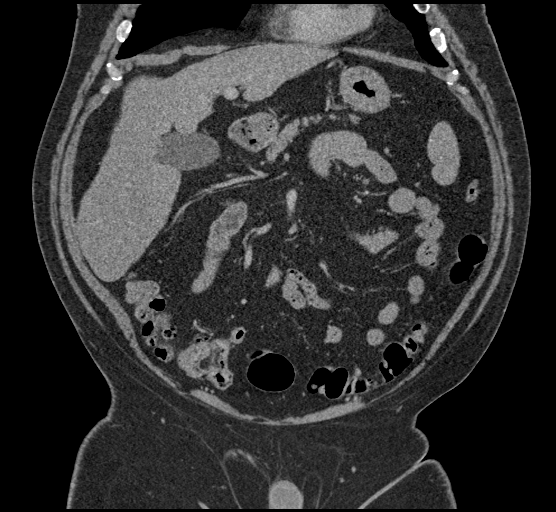
[im 85/191  soft-tissue]
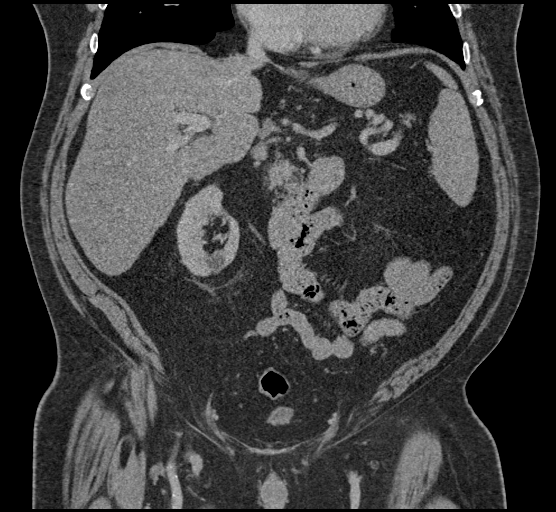
[im 106/191  soft-tissue]
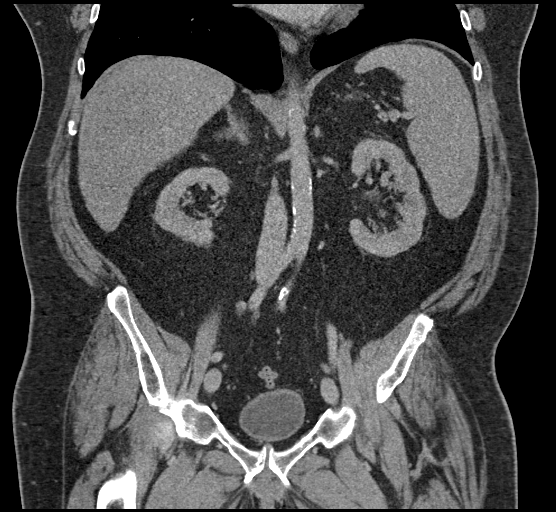

[16 of 46 positions shown; findings below may reference images not displayed]

FINDINGS: LOWER CHEST: There is no basilar pleural or apical pericardial
effusion.

HEPATOBILIARY: The hepatic contours and density are normal. There is
no intra- or extrahepatic biliary dilatation. The gallbladder is
normal.

PANCREAS: The pancreatic parenchymal contours are normal and there
is no ductal dilatation. There is no peripancreatic fluid
collection.

SPLEEN: Normal.

ADRENALS/URINARY TRACT:

--Adrenal glands: Normal.

--Right kidney/ureter: No hydronephrosis, nephroureterolithiasis,
perinephric stranding or solid renal mass.

--Left kidney/ureter: Single nonobstructing renal calculus measuring
12 mm. No hydronephrosis, perinephric stranding or solid renal mass.

--Urinary bladder: Normal for degree of distention

STOMACH/BOWEL:

--Stomach/Duodenum: There is no hiatal hernia or other gastric
abnormality. The duodenal course and caliber are normal.

--Small bowel: No dilatation or inflammation.

--Colon: No focal abnormality.

--Appendix: Not visualized. No right lower quadrant inflammation or
free fluid.

VASCULAR/LYMPHATIC: There is aortic atherosclerosis without
hemodynamically significant stenosis. No abdominal or pelvic
lymphadenopathy.

REPRODUCTIVE: Normal prostate size with symmetric seminal vesicles.

MUSCULOSKELETAL. Multilevel degenerative disc disease and facet
arthrosis. No bony spinal canal stenosis.

OTHER: None.
IMPRESSION: 1. No acute abdominal or pelvic abnormality.
2. Single nonobstructing left renal calculus measuring 12 mm,
unchanged.

Aortic Atherosclerosis ([id number]).

## 2021-01-29 IMAGING — CR DG CHEST 2V
2 series · 2 of 2 positions shown · non-contrast
Comparison: 09/11/2007

CLINICAL DATA: Increased fatigue and weakness, elevated blood
pressure, depression, former smoker

EXAM:
CHEST - 2 VIEW

[x chest ap]
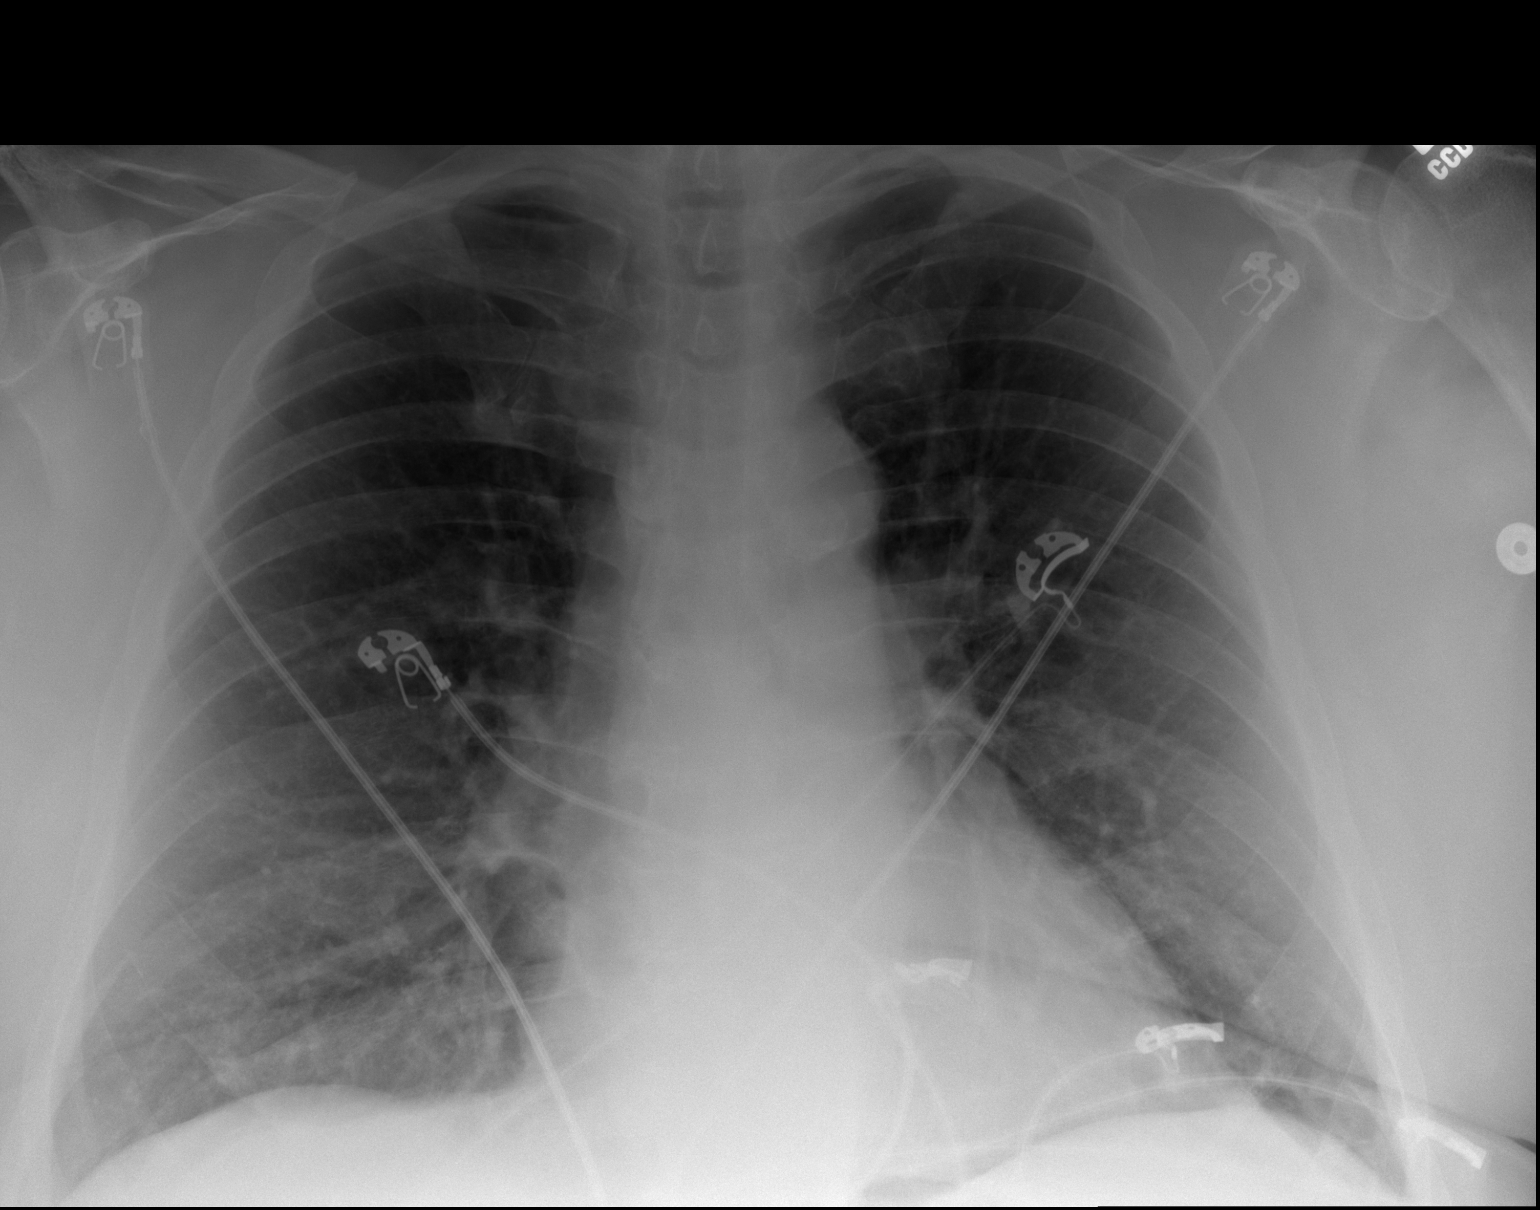

[w chest lat]
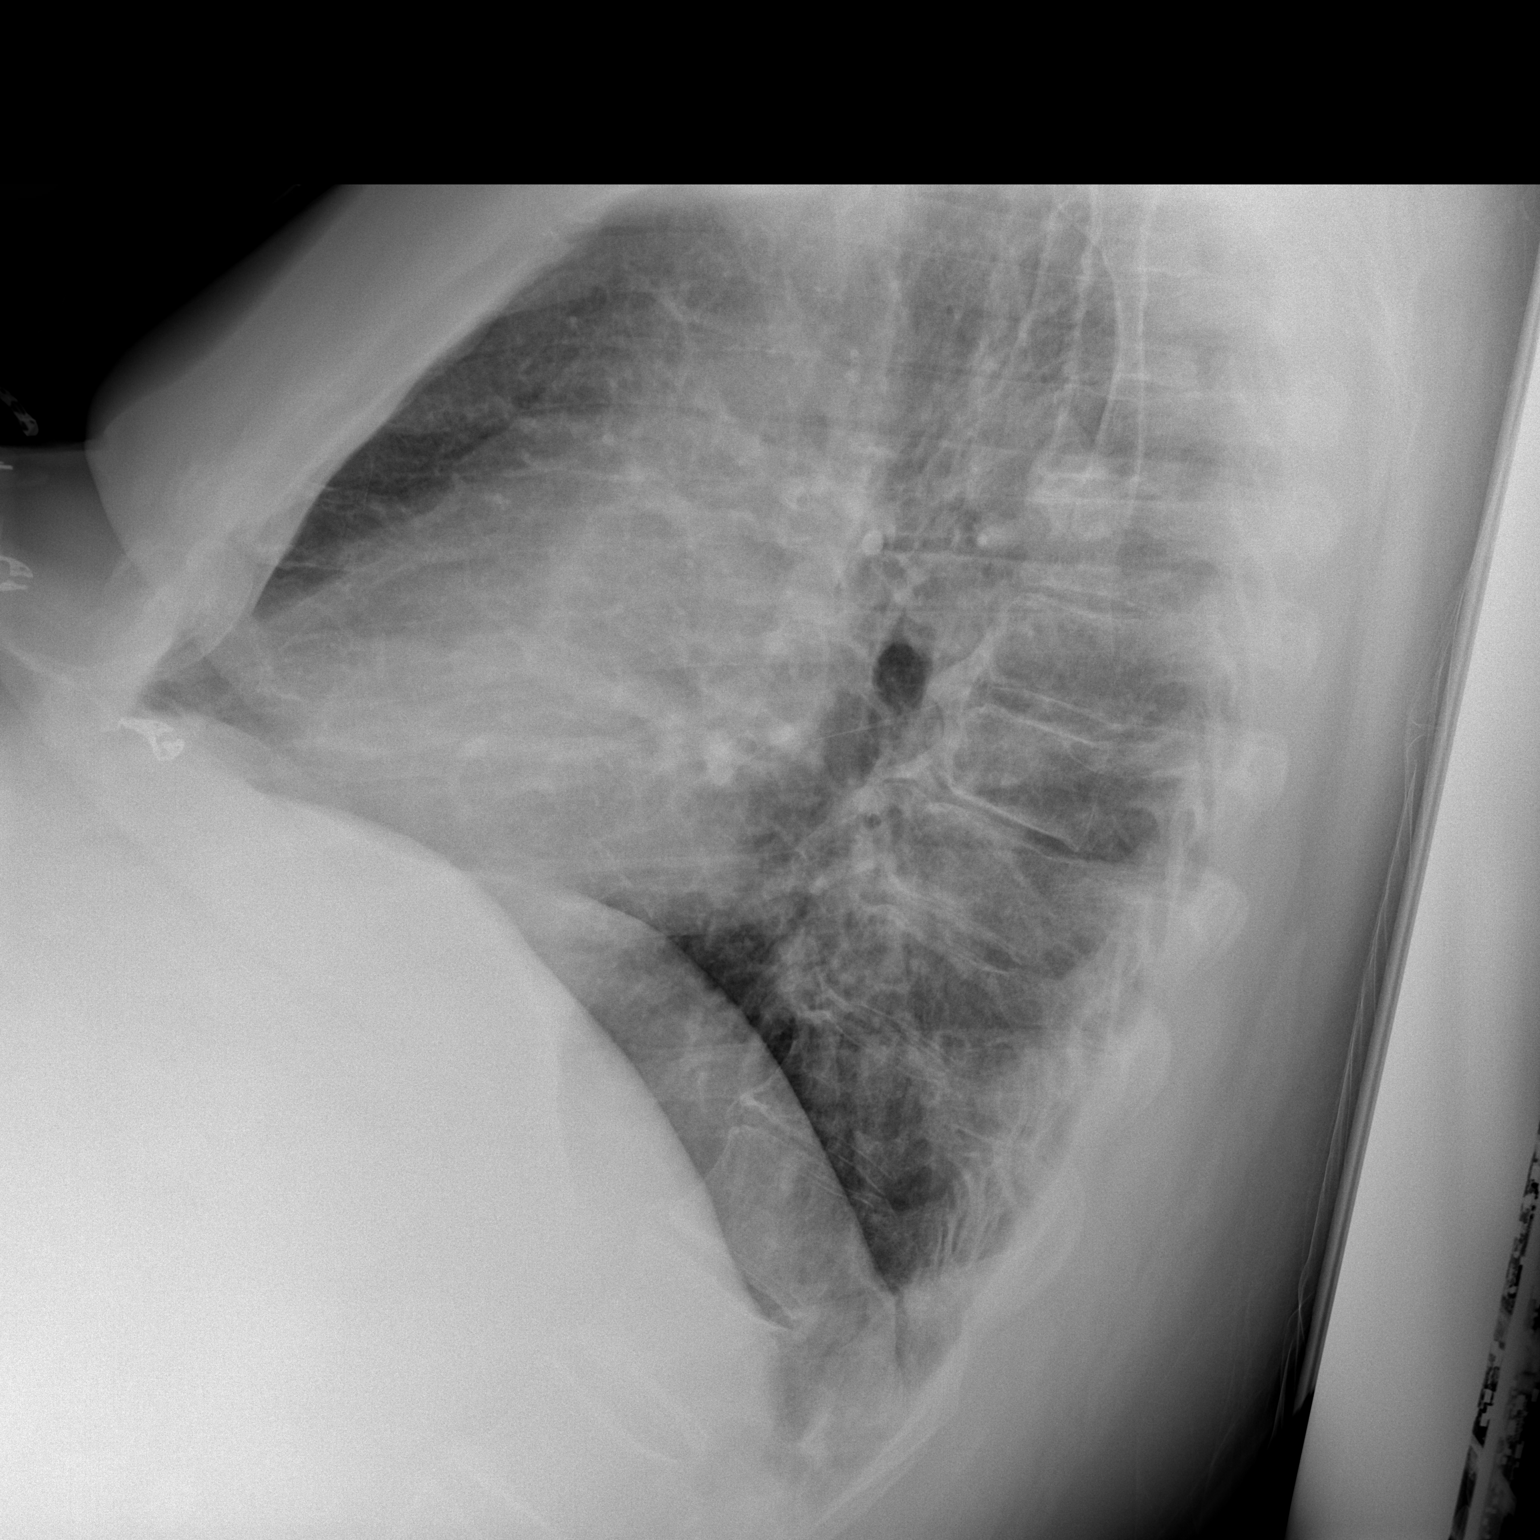

[2 of 2 positions shown; findings below may reference images not displayed]

FINDINGS: Normal heart size, mediastinal contours, and pulmonary vascularity.

Lungs clear.

No infiltrate, pleural effusion or pneumothorax.

Bones unremarkable.
IMPRESSION: No acute abnormalities.

## 2022-03-19 DIAGNOSIS — I342 Nonrheumatic mitral (valve) stenosis: Secondary | ICD-10-CM

## 2022-03-19 DIAGNOSIS — I350 Nonrheumatic aortic (valve) stenosis: Secondary | ICD-10-CM

## 2022-03-25 ENCOUNTER — Encounter: Payer: Self-pay | Admitting: Cardiology
